# Patient Record
Sex: Female | Born: 1990 | Race: Black or African American | Hispanic: No | Marital: Single | Smoking: Never smoker
Health system: Southern US, Community
[De-identification: ages and names within clinical notes are randomized; demographics above are authoritative.]

## PROBLEM LIST (undated history)

## (undated) HISTORY — PX: WISDOM TOOTH EXTRACTION: SHX21

---
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
## 2011-01-11 LAB — WET PREP, GENITAL
Trich, Wet Prep: NONE SEEN
Yeast Wet Prep HPF POC: NONE SEEN

## 2011-01-11 LAB — URINALYSIS, ROUTINE W REFLEX MICROSCOPIC
Bilirubin Urine: NEGATIVE
Glucose, UA: NEGATIVE mg/dL
Ketones, ur: NEGATIVE mg/dL
Leukocytes, UA: NEGATIVE
Nitrite: NEGATIVE
Protein, ur: NEGATIVE mg/dL
Specific Gravity, Urine: 1.031 — ABNORMAL HIGH (ref 1.005–1.030)
Urobilinogen, UA: 0.2 mg/dL (ref 0.0–1.0)
pH: 6.5 (ref 5.0–8.0)

## 2011-01-11 LAB — GC/CHLAMYDIA PROBE AMP, GENITAL
Chlamydia, DNA Probe: NEGATIVE
GC Probe Amp, Genital: NEGATIVE

## 2011-01-11 LAB — URINE MICROSCOPIC-ADD ON

## 2011-07-21 LAB — RAPID STREP SCREEN (MED CTR MEBANE ONLY): Streptococcus, Group A Screen (Direct): NEGATIVE

## 2011-07-21 LAB — MONONUCLEOSIS SCREEN: Mono Screen: NEGATIVE

## 2012-08-26 ENCOUNTER — Ambulatory Visit: Payer: Self-pay | Admitting: Family

## 2012-09-07 ENCOUNTER — Encounter: Payer: Self-pay | Admitting: Family

## 2012-09-07 ENCOUNTER — Ambulatory Visit (INDEPENDENT_AMBULATORY_CARE_PROVIDER_SITE_OTHER): Payer: PRIVATE HEALTH INSURANCE | Admitting: Family

## 2012-09-07 VITALS — BP 90/64 | HR 64 | Temp 98.0°F | Resp 16 | Ht 64.75 in | Wt 107.1 lb

## 2012-09-07 DIAGNOSIS — Z0000 Encounter for general adult medical examination without abnormal findings: Secondary | ICD-10-CM | POA: Insufficient documentation

## 2012-09-07 DIAGNOSIS — Z23 Encounter for immunization: Secondary | ICD-10-CM

## 2012-09-07 NOTE — Patient Instructions (Addendum)
Please return to the lab on Friday AM fasting- for lab work. Follow up in 1 year, sooner as needed.

## 2012-09-07 NOTE — Progress Notes (Signed)
Subjective:    Patient ID: Suzanne Mays, female    DOB: 1991/06/11, 21 y.o.   MRN: [id number]  HPI  Preventative- reports severe menstrual cramping.  She will see GYN.  Tries otc advil.  Period is 5 days.  1st day 5-7 pads a day.   Exercise- walks her dogs. Diet is not healthy.  Drinks water.      Review of Systems  Constitutional: Negative for unexpected weight change.  HENT: Negative for congestion.   Respiratory: Positive for cough. Negative for shortness of breath.        Reports recent cough for a few days- using mucinex.   Cardiovascular: Negative for chest pain.  Gastrointestinal: Negative for vomiting and constipation.  Genitourinary: Negative for dysuria and frequency.  Musculoskeletal: Negative for myalgias and arthralgias.  Skin: Negative for rash.  Neurological: Negative for headaches.  Hematological: Negative for adenopathy.  Psychiatric/Behavioral:       Denies depression or anxiety   History reviewed. No pertinent past medical history.  History   Social History  . Marital Status: Single    Spouse Name: N/A    Number of Children: N/A  . Years of Education: N/A   Occupational History  . Not on file.   Social History Main Topics  . Smoking status: Never Smoker   . Smokeless tobacco: Never Used  . Alcohol Use: No  . Drug Use: Not on file  . Sexually Active: Not on file   Other Topics Concern  . 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 Wisdom tooth extraction     Family History  Problem Relation Age of Onset  . Hypertension Mother   . Diabetes Mother   . Stroke Maternal Grandmother   . Cancer Maternal Grandmother     breast & brain    No Known Allergies  No current outpatient prescriptions on file prior  to visit.    BP 90/64  Pulse 64  Temp 98 F (36.7 C) (Oral)  Resp 16  Ht 5' 4.75" (1.645 m)  Wt 107 lb 1.3 oz (48.571 kg)  BMI 17.96 kg/m2  SpO2 99%  LMP 09/07/2012       Objective:   Physical Exam Physical Exam  Constitutional: She is oriented to person, place, and time. She appears well-developed and well-nourished. No distress.  HENT:  Head: Normocephalic and atraumatic.  Right Ear: Tympanic membrane and ear canal normal.  Left Ear: Tympanic membrane and ear canal normal.  Mouth/Throat: Oropharynx is clear and moist.  Eyes: Pupils are equal, round, and reactive to light. No scleral icterus.  Neck: Normal range of motion. No thyromegaly present.  Cardiovascular: Normal rate and regular rhythm.   No murmur heard. Pulmonary/Chest: Effort normal and breath sounds normal. No respiratory distress. He has no wheezes. She has no rales. She exhibits no tenderness.  Abdominal: Soft. Bowel sounds are normal. He exhibits no distension and no mass. There is no tenderness. There is no rebound and no guarding.  Musculoskeletal: She exhibits no edema.  Lymphadenopathy:    She has no cervical adenopathy.  Neurological: She is alert and oriented to person, place, and time. She has normal reflexes. She exhibits normal muscle tone. Coordination normal.  Skin: Skin is warm and dry.  Psychiatric: She has a normal mood and affect. Her behavior is normal. Judgment and thought content normal.  Breast/pelvic- deferred to GYN.          Assessment & Plan:          Assessment & Plan:

## 2012-09-07 NOTE — Assessment & Plan Note (Signed)
Flu shot today. Pt counseled on healthy diet, exercise.  She plans to establish with GYN.  She will return on Friday for fasting lab work.

## 2012-09-09 LAB — CBC WITH DIFFERENTIAL/PLATELET
Basophils Absolute: 0 10*3/uL (ref 0.0–0.1)
Basophils Relative: 0 % (ref 0–1)
Eosinophils Relative: 4 % (ref 0–5)
HCT: 35.7 % — ABNORMAL LOW (ref 36.0–46.0)
Lymphocytes Relative: 25 % (ref 12–46)
MCHC: 33.3 g/dL (ref 30.0–36.0)
Monocytes Absolute: 0.6 10*3/uL (ref 0.1–1.0)
Neutro Abs: 3.6 10*3/uL (ref 1.7–7.7)
Platelets: 306 10*3/uL (ref 150–400)
RDW: 13.1 % (ref 11.5–15.5)
WBC: 5.9 10*3/uL (ref 4.0–10.5)

## 2012-09-09 LAB — LIPID PANEL
Cholesterol: 187 mg/dL (ref 0–200)
HDL: 47 mg/dL (ref >39)
Triglycerides: 49 mg/dL (ref <150)

## 2012-09-09 LAB — BASIC METABOLIC PANEL WITH GFR
BUN: 22 mg/dL (ref 6–23)
Calcium: 9.1 mg/dL (ref 8.4–10.5)
Chloride: 105 mEq/L (ref 96–112)
Creat: 0.77 mg/dL (ref 0.50–1.10)
GFR, Est Non African American: >89 mL/min

## 2012-09-09 LAB — HEPATIC FUNCTION PANEL
ALT: <8 U/L (ref 0–35)
Bilirubin, Direct: <0.1 mg/dL (ref 0.0–0.3)
Total Protein: 7.4 g/dL (ref 6.0–8.3)

## 2012-09-10 LAB — URINALYSIS, MICROSCOPIC ONLY
Casts: NONE SEEN
Crystals: NONE SEEN
RBC / HPF: >50 RBC/hpf — AB (ref <3)

## 2012-09-10 LAB — URINALYSIS, ROUTINE W REFLEX MICROSCOPIC
Glucose, UA: NEGATIVE mg/dL
Ketones, ur: 15 mg/dL — AB
Specific Gravity, Urine: 1.025 (ref 1.005–1.030)

## 2012-09-12 ENCOUNTER — Telehealth: Payer: Self-pay | Admitting: Family

## 2012-09-12 DIAGNOSIS — R809 Proteinuria, unspecified: Secondary | ICD-10-CM

## 2012-09-12 MED ORDER — CIPROFLOXACIN HCL 500 MG PO TABS: 500.0000 mg | ORAL_TABLET | Freq: Two times a day (BID) | ORAL | Status: DC

## 2012-09-12 NOTE — Telephone Encounter (Signed)
Pls call pt and let her know that her urinalysis looks like she has uti.  I would like her to take cipro x 3 days and repeat UA with reflex micro in 3 weeks.  (dx is proteinuria).  Other labs look good except mildly anemic.  She should add multivitamin with minerals.

## 2012-09-13 NOTE — Telephone Encounter (Signed)
Notified pt and she voices understanding. Future lab order entered and given to the lab for the week of 10/04/12.

## 2012-09-30 ENCOUNTER — Encounter: Payer: Self-pay | Admitting: Family

## 2012-09-30 ENCOUNTER — Ambulatory Visit: Payer: PRIVATE HEALTH INSURANCE | Admitting: Family

## 2012-09-30 ENCOUNTER — Ambulatory Visit (INDEPENDENT_AMBULATORY_CARE_PROVIDER_SITE_OTHER): Payer: PRIVATE HEALTH INSURANCE | Admitting: Family

## 2012-09-30 VITALS — BP 90/68 | HR 76 | Temp 98.0°F | Resp 16 | Ht 64.75 in | Wt 107.0 lb

## 2012-09-30 DIAGNOSIS — Z01419 Encounter for gynecological examination (general) (routine) without abnormal findings: Secondary | ICD-10-CM | POA: Insufficient documentation

## 2012-09-30 DIAGNOSIS — Z124 Encounter for screening for malignant neoplasm of cervix: Secondary | ICD-10-CM

## 2012-09-30 DIAGNOSIS — Z113 Encounter for screening for infections with a predominantly sexual mode of transmission: Secondary | ICD-10-CM | POA: Insufficient documentation

## 2012-09-30 DIAGNOSIS — N946 Dysmenorrhea, unspecified: Secondary | ICD-10-CM

## 2012-09-30 MED ORDER — NORETHINDRONE ACET-ETHINYL EST 1.5-30 MG-MCG PO TABS: 1.0000 | ORAL_TABLET | Freq: Every day | ORAL | Status: DC

## 2012-09-30 NOTE — Progress Notes (Signed)
  Subjective:    Patient ID: Suzanne Mays, female    DOB: 1991/08/03, 21 y.o.   MRN: [id number]  HPI  Ms.  Mays is a 21 yr old female who presents today with chief complaining of dysmenorrhea. LMP 11/20. Period last 5 days.  Very heavy and painful the first 2 days, and then improves.  She is sexually active and has had 1 partner in the last 6 months.  Her last pap smear was when she was in HS and she reports that this was normal.   Review of Systems     No past medical history on file.  History   Social History  . Marital Status: Single    Spouse Name: N/A    Number of Children: N/A  . Years of Education: N/A   Occupational History  . Not on file.   Social History Main Topics  . Smoking status: Never Smoker   . Smokeless tobacco: Never Used  . Alcohol Use: No  . Drug Use: Not on file  . Sexually Active: Not on file   Other Topics Concern  . 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 Wisdom tooth extraction     Family History  Problem Relation Age of Onset  . Hypertension Mother   . Diabetes Mother   . Stroke Maternal Grandmother   . Cancer Maternal Grandmother     breast & brain    No Known Allergies  Current Outpatient Prescriptions on File Prior to Visit  Medication Sig Dispense Refill  . ciprofloxacin (CIPRO) 500 MG tablet Take 1 tablet (500 mg total) by mouth 2 (two) times daily.  6 tablet  0  . Prenat-FeCbn-FeBisg-FA-Omega (MULTIVITAMIN/MINERALS PO) Take 1 tablet by mouth.        BP 90/68  Pulse 76  Temp 98 F (36.7 C) (Oral)  Resp 16  Ht 5' 4.75" (1.645 m)  Wt 107 lb (48.535 kg)  BMI 17.94 kg/m2  SpO2 99%  LMP 09/07/2012    Objective:   Physical Exam  Constitutional: She is oriented to person, place, and  time. She appears well-developed and well-nourished. No distress.  Cardiovascular: Normal rate and regular rhythm.   No murmur heard. Pulmonary/Chest: Effort normal and breath sounds normal. No respiratory distress. She has no wheezes. She has no rales. She exhibits no tenderness.  Genitourinary:       Breasts: Examined lying  Right: Without masses, retractions, discharge or axillary adenopathy.  Left: Without masses, retractions, discharge or axillary adenopathy.  Inguinal/mons: Normal without inguinal adenopathy  External genitalia: Normal  BUS/Urethra/Skene's glands: Normal  Bladder: Normal  Vagina: Normal  Cervix: Normal  Uterus: normal in size, shape and contour. Midline and mobile  Adnexa/parametria:  Rt: Without masses or tenderness.  Lt: Without masses or tenderness.  Anus and perineum: Normal    Neurological: She is alert and oriented to person, place, and time.  Psychiatric: She has a normal mood and affect. Her behavior is normal. Judgment and thought content normal.          Assessment & Plan:

## 2012-09-30 NOTE — Patient Instructions (Addendum)
Follow up in 6 months for Pap smear- sooner if problems or concerns.

## 2012-10-03 DIAGNOSIS — N946 Dysmenorrhea, unspecified: Secondary | ICD-10-CM | POA: Insufficient documentation

## 2012-10-03 NOTE — Assessment & Plan Note (Signed)
Will give trial of OCP to see if this will help her dysmenorrhea.  Pap performed today.

## 2012-10-05 ENCOUNTER — Telehealth: Payer: Self-pay | Admitting: Family

## 2012-10-05 ENCOUNTER — Encounter: Payer: Self-pay | Admitting: Family

## 2012-10-05 DIAGNOSIS — R87629 Unspecified abnormal cytological findings in specimens from vagina: Secondary | ICD-10-CM

## 2012-10-05 DIAGNOSIS — R87619 Unspecified abnormal cytological findings in specimens from cervix uteri: Secondary | ICD-10-CM

## 2012-10-05 HISTORY — DX: Unspecified abnormal cytological findings in specimens from vagina: R87.629

## 2012-10-05 NOTE — Telephone Encounter (Signed)
Reviewed pap smear results- CIN-1/HPV (LSIL). Reviewed abnormal results with pt and need for her to be evaluated by GYN. She would like to see same OB/GYN as her mom and will call us this afternoon with the name.

## 2012-10-06 ENCOUNTER — Other Ambulatory Visit (HOSPITAL_COMMUNITY)
Admission: RE | Admit: 2012-10-06 | Discharge: 2012-10-06 | Disposition: A | Discharge status: Home / Self Care | Payer: PRIVATE HEALTH INSURANCE | Source: Ambulatory Visit | Attending: Family | Admitting: Family

## 2012-10-25 ENCOUNTER — Encounter: Payer: Self-pay | Admitting: Obstetrics and Gynecology

## 2012-10-25 ENCOUNTER — Ambulatory Visit (INDEPENDENT_AMBULATORY_CARE_PROVIDER_SITE_OTHER): Payer: PRIVATE HEALTH INSURANCE | Admitting: Obstetrics and Gynecology

## 2012-10-25 VITALS — BP 98/62 | HR 70 | Ht 64.5 in | Wt 113.0 lb

## 2012-10-25 DIAGNOSIS — Z87898 Personal history of other specified conditions: Secondary | ICD-10-CM

## 2012-10-25 DIAGNOSIS — Z8742 Personal history of other diseases of the female genital tract: Secondary | ICD-10-CM

## 2012-10-25 DIAGNOSIS — IMO0001 Reserved for inherently not codable concepts without codable children: Secondary | ICD-10-CM

## 2012-10-25 DIAGNOSIS — R350 Frequency of micturition: Secondary | ICD-10-CM

## 2012-10-25 DIAGNOSIS — Z309 Encounter for contraceptive management, unspecified: Secondary | ICD-10-CM

## 2012-10-25 LAB — POCT URINALYSIS DIPSTICK
Blood, UA: NEGATIVE
Ketones, UA: NEGATIVE
Protein, UA: NEGATIVE
Spec Grav, UA: <=1.005
pH, UA: 7

## 2012-10-25 MED ORDER — NORETHIN ACE-ETH ESTRAD-FE 1.5-30 MG-MCG PO TABS: 1.0000 | ORAL_TABLET | Freq: Every day | ORAL | Status: DC

## 2012-10-25 NOTE — Progress Notes (Signed)
Regular Periods: yes Mammogram: no  Monthly Breast Ex.: yes Exercise: no  Tetanus < 10 years: yes Seatbelts: yes  NI. Bladder Functn.: yes Abuse at home: no  Daily BM's: yes Stressful Work: no  Healthy Diet: yes Sigmoid-Colonoscopy: NO  Calcium: no Medical problems this year: DISCUSS BIRTH CONTROL AND ABNORMAL PAP    LAST PAP:12/13  Contraception: JUNEL  Mammogram:  NO  PCP: DR. Peggyann Juba  PMH: NO CHANGE  FMH: NO CHANGE  Last Bone Scan: NO  PT IS SINGLE;IN A RELATIONSHIP

## 2012-10-25 NOTE — Progress Notes (Signed)
Subjective:    Suzanne Mays is a 22 y.o. female, G0P0, who presents for an annual exam. 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   Menstrual cycle:   LMP: Patient's last menstrual period was 10/09/2012. Flow x 5 days with pad change every 3 hours with 10/10 cramps made better by BCPs             Review of Systems Pertinent items are noted in HPI. Denies pelvic pain, urinary tract symptoms, vaginitis symptoms, irregular bleeding, menopausal symptoms, change in bowel habits or rectal bleeding   Objective:    BP 98/62  Pulse 70  Ht 5' 4.5" (1.638 m)  Wt 113 lb (51.256 kg)  BMI 19.10 kg/m2  LMP 10/09/2012     Wt Readings from Last 1 Encounters:  10/25/12 113 lb (51.256 kg)   Body mass index is 19.10 kg/(m^2). General Appearance: Alert, no acute distress HEENT: Grossly normal Neck / Thyroid: Supple, no thyromegaly or cervical adenopathy Lungs: Clear to auscultation bilaterally Back: No CVA tenderness Breast Exam: No masses or nodes.No dimpling, nipple retraction or discharge. Cardiovascular: Regular rate and rhythm.  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 Peggyann Juba  results of abnormal PAP  Will schedule colposcopy once results are confirmed  RTO 1 year or prn  Falana Clagg,ELMIRAPA-C

## 2012-10-26 ENCOUNTER — Telehealth: Payer: Self-pay

## 2012-10-26 NOTE — Telephone Encounter (Signed)
TC TO PT REGARDING ABNORMAL PAP. PER EP, NEED TO SCHEDULE A COLPOSCOPY FOR PT. WENT OVER COLPO PROTOCOL AND INFORMED PT THAT I WILL SEND INFORMATION ON COLPOSCOPY. PROCEDURE SCHEDULED FOR 11/10/12 AT 2:45 PM WITH ND. INFORMED PT THAT IF SHE CANNOT KEEP APPT. OR HER CYCLE IS ON AND HEAVY TO CALL 24 HOUR BEFORE PROCEDURE TO RESCHEDULE. PT VOICED UNDERSTANDING.

## 2012-10-26 NOTE — Telephone Encounter (Signed)
Lm for pt to call back

## 2012-10-26 NOTE — Telephone Encounter (Signed)
Message copied by Winfred Leeds on Wed Oct 26, 2012  3:51 PM ------      Message from: Henreitta Leber      Created: Wed Oct 26, 2012  7:24 AM       Schedule with first available M.D. for colposcopy and notify patient. Thank you,  EP

## 2012-11-02 ENCOUNTER — Encounter: Payer: Self-pay | Admitting: Family

## 2012-11-02 NOTE — Telephone Encounter (Signed)
Please see patient's My Chart message & advise. Thanks/SLS

## 2012-11-10 ENCOUNTER — Ambulatory Visit: Payer: PRIVATE HEALTH INSURANCE | Admitting: Obstetrics and Gynecology

## 2012-11-10 ENCOUNTER — Encounter: Payer: Self-pay | Admitting: Obstetrics and Gynecology

## 2012-11-10 VITALS — BP 100/60 | Wt 110.0 lb

## 2012-11-10 DIAGNOSIS — IMO0002 Reserved for concepts with insufficient information to code with codable children: Secondary | ICD-10-CM

## 2012-11-10 DIAGNOSIS — N898 Other specified noninflammatory disorders of vagina: Secondary | ICD-10-CM

## 2012-11-10 DIAGNOSIS — R87612 Low grade squamous intraepithelial lesion on cytologic smear of cervix (LGSIL): Secondary | ICD-10-CM

## 2012-11-10 LAB — POCT URINE PREGNANCY: Preg Test, Ur: NEGATIVE

## 2012-11-10 LAB — POCT WET PREP (WET MOUNT)
Clue Cells Wet Prep Whiff POC: POSITIVE
PH, VAGINAL: 5.5

## 2012-11-10 MED ORDER — METRONIDAZOLE 500 MG PO TABS: 500.0000 mg | ORAL_TABLET | Freq: Two times a day (BID) | ORAL | Status: DC

## 2012-11-10 NOTE — Progress Notes (Signed)
Previous Pap Smear: LSIL CIN-1 HPV  Previous Colposcopy: n/a Referred From: n/a LMP: 10/09/12 Contraception: pill G,P: 0, 0 colpo adequate bx at 6 oclock .  AW changes noted thereeECc done Wet prep c/w BV RT 6 months for pap Flagyl rx given to pt

## 2012-11-10 NOTE — Patient Instructions (Signed)

## 2012-11-14 ENCOUNTER — Encounter: Payer: Self-pay | Admitting: Family

## 2012-11-14 ENCOUNTER — Ambulatory Visit (INDEPENDENT_AMBULATORY_CARE_PROVIDER_SITE_OTHER): Payer: PRIVATE HEALTH INSURANCE | Admitting: Family

## 2012-11-14 VITALS — BP 90/56 | HR 74 | Temp 98.6°F | Resp 16 | Ht 64.75 in | Wt 111.0 lb

## 2012-11-14 DIAGNOSIS — F32A Depression, unspecified: Secondary | ICD-10-CM

## 2012-11-14 DIAGNOSIS — B9689 Other specified bacterial agents as the cause of diseases classified elsewhere: Secondary | ICD-10-CM

## 2012-11-14 DIAGNOSIS — N760 Acute vaginitis: Secondary | ICD-10-CM

## 2012-11-14 DIAGNOSIS — A499 Bacterial infection, unspecified: Secondary | ICD-10-CM

## 2012-11-14 DIAGNOSIS — R87629 Unspecified abnormal cytological findings in specimens from vagina: Secondary | ICD-10-CM

## 2012-11-14 DIAGNOSIS — F329 Major depressive disorder, single episode, unspecified: Secondary | ICD-10-CM

## 2012-11-14 MED ORDER — SERTRALINE HCL 50 MG PO TABS: 50.0000 mg | ORAL_TABLET | Freq: Every day | ORAL | Status: DC

## 2012-11-14 NOTE — Patient Instructions (Addendum)
Please follow up in 1 month.  

## 2012-11-14 NOTE — Assessment & Plan Note (Signed)
This is being managed by GYN.

## 2012-11-14 NOTE — Assessment & Plan Note (Addendum)
I suspect that depression is contributing to her "fatigue."  PHQ-9 was completed and pt scored 14/27 placing her in the "moderate depression" category.  Will give trial of sertraline- I instructed pt to start 1/2 tablet once daily for 1 week and then increase to a full tablet once daily on week two as tolerated.  We discussed common side effects such as nausea, drowsiness and weight gain.  Also discussed rare but serious side effect of suicide ideation.  She is instructed to discontinue medication go directly to ED if this occurs.  Pt verbalizes understanding.  Plan follow up in 1 month to evaluate progress.  15 minutes spent with pt today.  >50% of this time was spent counseling pt on her depression/treatment.

## 2012-11-14 NOTE — Progress Notes (Signed)
  Subjective:    Patient ID: Suzanne Mays, female    DOB: 10-08-1991, 22 y.o.   MRN: [id number]  HPI  Fatigue- reports that she wakes up feeling tired.  She sleeps 7 hours.  Often feels like she is unmotivated, sluggish, "just there." She started centrum but doesn't feel that this is helping her energy.   Bacterial vaginosis- recently saw GYN for her abnormal pap and wet prep + BV. She is currently taking metronidazole.    Review of Systems See HPI  No past medical history on file.  History   Social History  . Marital Status: Single    Spouse Name: N/A    Number of Children: N/A  . Years of Education: N/A   Occupational History  . Not on file.   Social History Main Topics  . Smoking status: Never Smoker   . Smokeless tobacco: Never Used  . Alcohol Use: No  . Drug Use: No  . Sexually Active: Yes    Birth Control/ Protection: Pill     Comment: JUNEL    Other Topics Concern  . 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 Wisdom tooth extraction     Family History  Problem Relation Age of Onset  . Hypertension Mother   . Diabetes Mother   . Stroke Maternal Grandmother   . Cancer Maternal Grandmother     breast & brain    No Known Allergies  Current Outpatient Prescriptions on File Prior to Visit  Medication Sig Dispense Refill  . metroNIDAZOLE (FLAGYL) 500 MG tablet Take 1 tablet (500 mg total) by mouth 2 (two) times daily.  14 tablet  0  . Multiple Vitamins-Minerals (CENTRUM PO) Take 1 tablet by mouth daily.      . norethindrone-ethinyl estradiol-iron (MICROGESTIN FE,GILDESS FE,LOESTRIN FE) 1.5-30 MG-MCG tablet Take 1 tablet by mouth daily.  1 Package  11    BP 90/56  Pulse 74  Temp 98.6 F (37 C) (Oral)  Resp 16  Ht 5'  4.75" (1.645 m)  Wt 111 lb (50.349 kg)  BMI 18.61 kg/m2  SpO2 99%  LMP 10/09/2012       Objective:   Physical Exam  Constitutional: She appears well-developed and well-nourished. No distress.  Psychiatric: Her behavior is normal. Judgment and thought content normal.       Pleasant but slightly flat affect          Assessment & Plan:

## 2012-11-14 NOTE — Assessment & Plan Note (Signed)
Recommended that pt complete metronidazole. Call if recurrent symptoms next month.

## 2012-11-17 ENCOUNTER — Telehealth: Payer: Self-pay

## 2012-11-17 NOTE — Telephone Encounter (Signed)
Spoke with pt rgd labs informed results pt voice understanding

## 2012-11-17 NOTE — Telephone Encounter (Signed)
Message copied by Rolla Plate on Thu Nov 17, 2012  9:02 AM ------      Message from: Jaymes Graff      Created: Wed Nov 16, 2012  9:10 PM       Please review colpo results with patient and tell her I recommend a pap every six months for the next year.

## 2012-12-22 ENCOUNTER — Encounter: Payer: Self-pay | Admitting: Obstetrics and Gynecology

## 2012-12-23 ENCOUNTER — Telehealth: Payer: Self-pay | Admitting: Obstetrics and Gynecology

## 2012-12-23 NOTE — Telephone Encounter (Signed)
TC to pt in response to E-mail.   LM to return call.

## 2012-12-27 ENCOUNTER — Encounter: Payer: PRIVATE HEALTH INSURANCE | Admitting: Obstetrics and Gynecology

## 2013-01-13 ENCOUNTER — Encounter (HOSPITAL_BASED_OUTPATIENT_CLINIC_OR_DEPARTMENT_OTHER): Payer: Self-pay | Admitting: *Deleted

## 2013-01-13 ENCOUNTER — Emergency Department (HOSPITAL_BASED_OUTPATIENT_CLINIC_OR_DEPARTMENT_OTHER)
Admission: EM | Admit: 2013-01-13 | Discharge: 2013-01-13 | Disposition: A | Discharge status: Home / Self Care | Payer: No Typology Code available for payment source | Attending: Emergency Medicine | Admitting: Emergency Medicine

## 2013-01-13 DIAGNOSIS — S8990XA Unspecified injury of unspecified lower leg, initial encounter: Secondary | ICD-10-CM | POA: Insufficient documentation

## 2013-01-13 DIAGNOSIS — Y9389 Activity, other specified: Secondary | ICD-10-CM | POA: Insufficient documentation

## 2013-01-13 DIAGNOSIS — Z79899 Other long term (current) drug therapy: Secondary | ICD-10-CM | POA: Insufficient documentation

## 2013-01-13 DIAGNOSIS — IMO0002 Reserved for concepts with insufficient information to code with codable children: Secondary | ICD-10-CM | POA: Insufficient documentation

## 2013-01-13 DIAGNOSIS — S59909A Unspecified injury of unspecified elbow, initial encounter: Secondary | ICD-10-CM | POA: Insufficient documentation

## 2013-01-13 DIAGNOSIS — S6990XA Unspecified injury of unspecified wrist, hand and finger(s), initial encounter: Secondary | ICD-10-CM | POA: Insufficient documentation

## 2013-01-13 DIAGNOSIS — S99929A Unspecified injury of unspecified foot, initial encounter: Secondary | ICD-10-CM | POA: Insufficient documentation

## 2013-01-13 DIAGNOSIS — Y92410 Unspecified street and highway as the place of occurrence of the external cause: Secondary | ICD-10-CM | POA: Insufficient documentation

## 2013-01-13 DIAGNOSIS — S199XXA Unspecified injury of neck, initial encounter: Secondary | ICD-10-CM | POA: Insufficient documentation

## 2013-01-13 DIAGNOSIS — S0993XA Unspecified injury of face, initial encounter: Secondary | ICD-10-CM | POA: Insufficient documentation
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
## 2013-01-13 NOTE — ED Notes (Signed)
Pt c/o "sore and aching" all over. When asked what hurts the most, pt states her low back and right wrist.
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
## 2013-01-16 ENCOUNTER — Ambulatory Visit (HOSPITAL_BASED_OUTPATIENT_CLINIC_OR_DEPARTMENT_OTHER)
Admission: RE | Admit: 2013-01-16 | Discharge: 2013-01-16 | Disposition: A | Discharge status: Home / Self Care | Payer: PRIVATE HEALTH INSURANCE | Source: Ambulatory Visit | Attending: Family | Admitting: Family

## 2013-01-16 ENCOUNTER — Ambulatory Visit (INDEPENDENT_AMBULATORY_CARE_PROVIDER_SITE_OTHER): Payer: PRIVATE HEALTH INSURANCE | Admitting: Family

## 2013-01-16 ENCOUNTER — Encounter: Payer: Self-pay | Admitting: Family

## 2013-01-16 VITALS — BP 98/60 | HR 68 | Temp 98.6°F | Resp 16 | Wt 110.1 lb

## 2013-01-16 DIAGNOSIS — M545 Low back pain, unspecified: Secondary | ICD-10-CM | POA: Insufficient documentation

## 2013-01-16 DIAGNOSIS — IMO0001 Reserved for inherently not codable concepts without codable children: Secondary | ICD-10-CM

## 2013-01-16 DIAGNOSIS — M549 Dorsalgia, unspecified: Secondary | ICD-10-CM

## 2013-01-16 DIAGNOSIS — Z0189 Encounter for other specified special examinations: Secondary | ICD-10-CM

## 2013-01-16 DIAGNOSIS — M7918 Myalgia, other site: Secondary | ICD-10-CM | POA: Insufficient documentation

## 2013-01-16 HISTORY — DX: Myalgia, other site: M79.18

## 2013-01-16 MED ORDER — CYCLOBENZAPRINE HCL 5 MG PO TABS: 5.0000 mg | ORAL_TABLET | Freq: Every evening | ORAL | Status: DC | PRN

## 2013-01-16 MED ORDER — NORETHIN ACE-ETH ESTRAD-FE 1.5-30 MG-MCG PO TABS: 1.0000 | ORAL_TABLET | Freq: Every day | ORAL | Status: DC

## 2013-01-16 MED ORDER — MELOXICAM 7.5 MG PO TABS: 7.5000 mg | ORAL_TABLET | Freq: Every day | ORAL | Status: DC
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
## 2013-01-16 NOTE — Patient Instructions (Addendum)
Please complete your x ray on the first floor.  Follow up in 6 months, sooner if problems or concerns.

## 2013-01-18 ENCOUNTER — Other Ambulatory Visit: Payer: Self-pay | Admitting: Family

## 2013-01-19 MED ORDER — CYCLOBENZAPRINE HCL 5 MG PO TABS: 5.0000 mg | ORAL_TABLET | Freq: Every evening | ORAL | Status: DC | PRN

## 2013-01-19 MED ORDER — MELOXICAM 7.5 MG PO TABS: 7.5000 mg | ORAL_TABLET | Freq: Every day | ORAL | Status: DC

## 2013-05-24 ENCOUNTER — Encounter: Payer: Self-pay | Admitting: Family

## 2013-05-24 NOTE — Telephone Encounter (Signed)
Can you please schedule this pt?

## 2013-05-29 ENCOUNTER — Ambulatory Visit: Payer: PRIVATE HEALTH INSURANCE | Admitting: Physician Assistant

## 2013-05-30 ENCOUNTER — Ambulatory Visit (INDEPENDENT_AMBULATORY_CARE_PROVIDER_SITE_OTHER): Payer: PRIVATE HEALTH INSURANCE | Admitting: Physician Assistant

## 2013-05-30 ENCOUNTER — Telehealth: Payer: Self-pay | Admitting: *Deleted

## 2013-05-30 ENCOUNTER — Encounter: Payer: Self-pay | Admitting: Physician Assistant

## 2013-05-30 VITALS — BP 94/62 | HR 94 | Temp 98.9°F | Resp 14 | Wt 111.5 lb

## 2013-05-30 DIAGNOSIS — Z23 Encounter for immunization: Secondary | ICD-10-CM

## 2013-05-30 DIAGNOSIS — A09 Infectious gastroenteritis and colitis, unspecified: Secondary | ICD-10-CM

## 2013-05-30 DIAGNOSIS — Z789 Other specified health status: Secondary | ICD-10-CM | POA: Insufficient documentation

## 2013-05-30 DIAGNOSIS — Z111 Encounter for screening for respiratory tuberculosis: Secondary | ICD-10-CM

## 2013-05-30 DIAGNOSIS — J020 Streptococcal pharyngitis: Secondary | ICD-10-CM

## 2013-05-30 MED ORDER — NORETHIN ACE-ETH ESTRAD-FE 1.5-30 MG-MCG PO TABS: 1.0000 | ORAL_TABLET | Freq: Every day | ORAL | Status: DC

## 2013-05-30 MED ORDER — CHLOROQUINE PHOSPHATE 250 MG PO TABS: ORAL_TABLET | ORAL | Status: DC

## 2013-05-30 MED ORDER — TYPHOID VACCINE PO CPDR: 1.0000 | DELAYED_RELEASE_CAPSULE | ORAL | Status: DC

## 2013-05-30 MED ORDER — CIPROFLOXACIN HCL 500 MG PO TABS: 500.0000 mg | ORAL_TABLET | Freq: Two times a day (BID) | ORAL | Status: DC

## 2013-05-30 NOTE — Telephone Encounter (Signed)
Spoke with pt, doesn't have any medication specific forms. Requests letter to keep on hand in case she needs proof of medical necessity. Will pick up letter at PPD reading.

## 2013-05-30 NOTE — Progress Notes (Signed)
Patient ID: Suzanne Mays, female   DOB: 08-28-1991, 22 y.o.   MRN: [id number]   Patient is a 22 year-old female who presents to clinic today for immunizations prior to travel abroad to [place].   Patient is also wanting [place] to take a look at her throat to make sure she is better.  Was recently diagnosed and treated for strep pharyngitis 1 week ago.  Has felt better since then.  Noticed she has developed some nasal congestion but denies sinus pressure/pain, sore throat, cough, COB or wheezing.  Patient denies fever, N/V/C/D.    No past medical history on file.  Current Outpatient Prescriptions on File Prior to Visit  Medication Sig Dispense Refill  . cyclobenzaprine (FLEXERIL) 5 MG tablet Take 1 tablet (5 mg total) by mouth at bedtime as needed for muscle spasms.  14 tablet  0  . meloxicam (MOBIC) 7.5 MG tablet Take 1 tablet (7.5 mg total) by mouth daily.  14 tablet  0  . Multiple Vitamins-Minerals (CENTRUM PO) Take 1 tablet by mouth daily.       No current facility-administered medications on file prior to visit.    No Known Allergies  Family History  Problem Relation Age of Onset  . Hypertension Mother   . Diabetes Mother   . Stroke Maternal Grandmother   . Cancer Maternal Grandmother     breast & brain   History   Social History  . Marital Status: Single    Spouse Name: N/A    Number of Children: N/A  . Years of Education: N/A   Social History Main Topics  . Smoking status: Never Smoker   . Smokeless tobacco: Never Used  . Alcohol Use: No  . Drug Use: No  . Sexually Active: Yes    Birth Control/ Protection: Pill     Comment: JUNEL    Other Topics Concern  . None   Social History Narrative   Journalism/media studies- [employer]   Lives with mom and 2 dogs   Enjoys sleeping in her spare time.    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  HENT: Positive for congestion. Negative for nosebleeds and sore throat.   Respiratory: Negative for cough, hemoptysis, sputum production, shortness of breath and wheezing.   Gastrointestinal: Negative for nausea, vomiting, abdominal pain, diarrhea and constipation.  Musculoskeletal: Negative for myalgias.  Skin: Negative for rash.  Neurological: Negative for headaches.   Filed Vitals:   05/30/13 0931  BP: 94/62  Pulse: 94  Temp: 98.9 F (37.2 C)  Resp: 14   Physical Exam  Constitutional: She is oriented to person, place, and time and well-developed, well-nourished, and in no distress. No distress.  HENT:  Head: Normocephalic and atraumatic.  Right Ear: External ear normal.  Left Ear: External ear normal.  Nose: Nose normal.  Mouth/Throat: Oropharynx is clear and moist. No oropharyngeal exudate.  TMs WNL bilaterally  Eyes: Conjunctivae are normal. Pupils are equal, round, and reactive to light.  Neck: Normal range of motion. Neck supple.  Cardiovascular: Normal rate, regular rhythm, normal heart sounds and intact distal pulses.   Pulmonary/Chest: Effort normal and breath sounds normal. No respiratory distress. She has no wheezes. She has no rales. She exhibits no tenderness.  Abdominal: Soft. Bowel sounds are normal. She exhibits no distension. There is no tenderness.  Lymphadenopathy:    She has no cervical adenopathy.  Neurological: She is alert and oriented to person, place, and time.  Skin: Skin is warm and dry. No rash noted. She is not diaphoretic.   .Assessment/Plan: Strep pharyngitis resolved  Travel foreign Rx Ciprofloxacin given for traveler's diarrhea prophylaxis.  PPD Placement.  Hepatitis A Immunization given.  Rx for Typhoid PO vaccine given.  Rx for colchicine for malaria prophylaxis given.  Patient instructed on proper regimen for malaria prophylaxis.  Patient to return in 48 hours for PPD reading and to retrieve copy of her  immunization records.

## 2013-05-30 NOTE — Telephone Encounter (Signed)
Patient is returning in 48 hours for PPD skin test reading.  We can print out her immunization records and give her a letter with the required information on it.  Ask her is she has a form for [place] to fill out or if I need to type a letter with all of the information in it.

## 2013-05-30 NOTE — Telephone Encounter (Signed)
Microgestin rx printed at today's office visit and was called to pharmacy voicemail per order in EPIC.

## 2013-05-30 NOTE — Assessment & Plan Note (Signed)
resolved 

## 2013-05-30 NOTE — Telephone Encounter (Signed)
Pt left message stating she forgot to request a letter indicating why she needs each medication that she is taking. She needs this for her upcoming trip in case her medications get confiscated she will have a letter of medical necessity. Please advise.

## 2013-05-30 NOTE — Assessment & Plan Note (Signed)
Rx Ciprofloxacin given for traveler's diarrhea prophylaxis.  PPD Placement.  Hepatitis A Immunization given.  Rx for Typhoid PO vaccine given.  Rx for colchicine for malaria prophylaxis given.  Patient instructed on proper regimen for malaria prophylaxis.  Patient to return in 48 hours for PPD reading and to retrieve copy of her immunization records.

## 2013-06-01 ENCOUNTER — Encounter: Payer: Self-pay | Admitting: Physician Assistant

## 2013-06-01 LAB — TB SKIN TEST
Induration: 0 mm
TB Skin Test: NEGATIVE

## 2013-08-24 ENCOUNTER — Other Ambulatory Visit: Payer: Self-pay

## 2013-10-08 ENCOUNTER — Encounter (HOSPITAL_BASED_OUTPATIENT_CLINIC_OR_DEPARTMENT_OTHER): Payer: Self-pay | Admitting: Emergency Medicine

## 2013-10-08 DIAGNOSIS — Z79899 Other long term (current) drug therapy: Secondary | ICD-10-CM | POA: Insufficient documentation

## 2013-10-08 DIAGNOSIS — Z792 Long term (current) use of antibiotics: Secondary | ICD-10-CM | POA: Insufficient documentation

## 2013-10-08 DIAGNOSIS — R52 Pain, unspecified: Secondary | ICD-10-CM | POA: Insufficient documentation

## 2013-10-08 DIAGNOSIS — Z791 Long term (current) use of non-steroidal anti-inflammatories (NSAID): Secondary | ICD-10-CM | POA: Insufficient documentation

## 2013-10-08 DIAGNOSIS — Z3202 Encounter for pregnancy test, result negative: Secondary | ICD-10-CM | POA: Insufficient documentation

## 2013-10-08 DIAGNOSIS — R609 Edema, unspecified: Secondary | ICD-10-CM | POA: Insufficient documentation

## 2013-10-08 NOTE — ED Notes (Signed)
Pt c/o generalized body aches and feet swelling. Pt just returned to [place] from [place] and had a 13 hour flight. Pt in NAD in triage, denies fever or rash. No SOB.

## 2013-10-09 ENCOUNTER — Emergency Department (HOSPITAL_BASED_OUTPATIENT_CLINIC_OR_DEPARTMENT_OTHER)
Admission: EM | Admit: 2013-10-09 | Discharge: 2013-10-09 | Disposition: A | Discharge status: Home / Self Care | Payer: PRIVATE HEALTH INSURANCE | Attending: Emergency Medicine | Admitting: Emergency Medicine

## 2013-10-09 ENCOUNTER — Telehealth: Payer: Self-pay | Admitting: Family

## 2013-10-09 DIAGNOSIS — R609 Edema, unspecified: Secondary | ICD-10-CM

## 2013-10-09 LAB — URINALYSIS, ROUTINE W REFLEX MICROSCOPIC
Bilirubin Urine: NEGATIVE
Ketones, ur: NEGATIVE mg/dL
Leukocytes, UA: NEGATIVE
Nitrite: NEGATIVE
Urobilinogen, UA: 1 mg/dL (ref 0.0–1.0)
pH: 7.5 (ref 5.0–8.0)

## 2013-10-09 MED ORDER — NAPROXEN 250 MG PO TABS
500.0000 mg | ORAL_TABLET | Freq: Once | ORAL | Status: AC
  Administered 2013-10-09: 500 mg via ORAL

## 2013-10-09 MED ORDER — NAPROXEN 250 MG PO TABS
ORAL_TABLET | ORAL | Status: AC
  Filled 2013-10-09: qty 2

## 2013-10-09 NOTE — ED Notes (Signed)
D/c home with family- medicated for pain at d/c- smiling and pleasant at d/c

## 2013-10-09 NOTE — ED Provider Notes (Signed)
CSN: [id number]     Arrival date & time 10/08/13  2343 History   First MD Initiated Contact with Patient 10/09/13 0157     Chief Complaint  Patient presents with  . Leg Swelling   (Consider location/radiation/quality/duration/timing/severity/associated sxs/prior Treatment) HPI This is a 22 year old female who had a 13 hour flight from [place] yesterday landing at about 7 PM. By the time she landed she had edema in her ankles and feet significant enough that she could not put her shoes on. She also developed generalized muscle aches. Symptoms are moderate. The swelling in her lower legs has nearly resolved with elevation. She is concerned about a DVT. She denies chest pain or shortness of breath. She has had no fever, chills or other evidence of infection. She had no similar symptoms when she flew to [place].  History reviewed. No pertinent past medical history. Past Surgical History  Procedure Laterality Date  . Wisdom tooth extraction     Family History  Problem Relation Age of Onset  . Hypertension Mother   . Diabetes Mother   . Stroke Maternal Grandmother   . Cancer Maternal Grandmother     breast & brain   History  Substance Use Topics  . Smoking status: Never Smoker   . Smokeless tobacco: Never Used  . Alcohol Use: No   OB History   Grav Para Term Preterm Abortions TAB SAB Ect Mult Living   0              Review of Systems  All other systems reviewed and are negative.    Allergies  Review of patient's allergies indicates no known allergies.  Home Medications   Current Outpatient Rx  Name  Route  Sig  Dispense  Refill  . norethindrone-ethinyl estradiol-iron (MICROGESTIN FE,GILDESS FE,LOESTRIN FE) 1.5-30 MG-MCG tablet   Oral   Take 1 tablet by mouth daily.   4 Package   0     Give 28 day packet please-patient going out of cou ...   . chloroquine (ARALEN) 250 MG tablet      Take 2 tablets (500mg ) once a week, until all tablets are gone   18 tablet   0    . ciprofloxacin (CIPRO) 500 MG tablet   Oral   Take 1 tablet (500 mg total) by mouth 2 (two) times daily.   20 tablet   0   . cyclobenzaprine (FLEXERIL) 5 MG tablet   Oral   Take 1 tablet (5 mg total) by mouth at bedtime as needed for muscle spasms.   14 tablet   0   . meloxicam (MOBIC) 7.5 MG tablet   Oral   Take 1 tablet (7.5 mg total) by mouth daily.   14 tablet   0   . Multiple Vitamins-Minerals (CENTRUM PO)   Oral   Take 1 tablet by mouth daily.         . typhoid (VIVOTIF) DR capsule   Oral   Take 1 capsule by mouth every other day.   14 capsule   0    BP 104/70  Pulse 89  Temp(Src) 98.3 F (36.8 C) (Oral)  Resp 16  Ht 5\' 4"  (1.626 m)  Wt 120 lb (54.432 kg)  BMI 20.59 kg/m2  SpO2 100%  Physical Exam General: Well-developed, well-nourished female in no acute distress; appearance consistent with age of record HENT: normocephalic; atraumatic Eyes: pupils equal, round and reactive to light; extraocular muscles intact Neck: supple Heart: regular rate and rhythm; no  murmurs, rubs or gallops Lungs: clear to auscultation bilaterally Abdomen: soft; nondistended; nontender; no masses or hepatosplenomegaly; bowel sounds present Extremities: No deformity; full range of motion; pulses normal; generalized mild muscle tenderness; no edema Neurologic: Awake, alert and oriented; motor function intact in all extremities and symmetric; no facial droop Skin: Warm and dry Psychiatric: Normal mood and affect    ED Course  Procedures (including critical care time)    MDM   Nursing notes and vitals signs, including pulse oximetry, reviewed.  Summary of this visit's results, reviewed by myself:  Labs:  Results for orders placed during the hospital encounter of 10/09/13 (from the past 24 hour(s))  URINALYSIS, ROUTINE W REFLEX MICROSCOPIC     Status: None   Collection Time    10/09/13 12:13 AM      Result Value Range   Color, Urine YELLOW  YELLOW   APPearance  CLEAR  CLEAR   Specific Gravity, Urine 1.010  1.005 - 1.030   pH 7.5  5.0 - 8.0   Glucose, UA NEGATIVE  NEGATIVE mg/dL   Hgb urine dipstick NEGATIVE  NEGATIVE   Bilirubin Urine NEGATIVE  NEGATIVE   Ketones, ur NEGATIVE  NEGATIVE mg/dL   Protein, ur NEGATIVE  NEGATIVE mg/dL   Urobilinogen, UA 1.0  0.0 - 1.0 mg/dL   Nitrite NEGATIVE  NEGATIVE   Leukocytes, UA NEGATIVE  NEGATIVE  PREGNANCY, URINE     Status: None   Collection Time    10/09/13 12:13 AM      Result Value Range   Preg Test, Ur NEGATIVE  NEGATIVE  D-DIMER, QUANTITATIVE     Status: None   Collection Time    10/09/13 12:25 AM      Result Value Range   D-Dimer, Quant <0.27  0.00 - 0.48 ug/mL-FEU       Hanley Seamen, MD 10/09/13 [phone]

## 2013-10-10 NOTE — Telephone Encounter (Addendum)
Opened in error

## 2013-11-13 ENCOUNTER — Encounter: Payer: Self-pay | Admitting: Family

## 2013-11-13 ENCOUNTER — Other Ambulatory Visit: Payer: Self-pay | Admitting: Family

## 2013-11-13 NOTE — Telephone Encounter (Signed)
Left detailed message informing patient of medication refill and that she needs to be seen before further refills can be given. °

## 2013-11-13 NOTE — Telephone Encounter (Signed)
Pt was due for a 6 month f/u with Melissa in September and has no appts on file with us. Please let pt know we sent 30 days supply of birth control and she will need to be seen for follow up for futher refills.

## 2013-11-14 NOTE — Telephone Encounter (Signed)
Patient scheduled appointment for 11/17/13 °

## 2013-11-17 ENCOUNTER — Encounter: Payer: Self-pay | Admitting: Family

## 2013-11-17 ENCOUNTER — Ambulatory Visit (INDEPENDENT_AMBULATORY_CARE_PROVIDER_SITE_OTHER): Payer: PRIVATE HEALTH INSURANCE | Admitting: Family

## 2013-11-17 VITALS — BP 80/58 | HR 76 | Temp 98.6°F | Resp 16 | Ht 64.75 in | Wt 109.0 lb

## 2013-11-17 DIAGNOSIS — N946 Dysmenorrhea, unspecified: Secondary | ICD-10-CM

## 2013-11-17 DIAGNOSIS — R87629 Unspecified abnormal cytological findings in specimens from vagina: Secondary | ICD-10-CM

## 2013-11-17 DIAGNOSIS — F32A Depression, unspecified: Secondary | ICD-10-CM

## 2013-11-17 DIAGNOSIS — F329 Major depressive disorder, single episode, unspecified: Secondary | ICD-10-CM

## 2013-11-17 DIAGNOSIS — F3289 Other specified depressive episodes: Secondary | ICD-10-CM

## 2013-11-17 MED ORDER — NORETHIN ACE-ETH ESTRAD-FE 1.5-30 MG-MCG PO TABS: 1.0000 | ORAL_TABLET | Freq: Every day | ORAL | Status: DC

## 2013-11-17 NOTE — Progress Notes (Signed)
   Subjective:    Patient ID: Suzanne CousinJalen Mays, female    DOB: 01/25/1991, 23 y.o.   MRN: [id number]  HPI  Suzanne Mays is a 23 yr old female who presents today for follow up.   We discussed depression last 1 year ago at which time she was started on a trial of zoloft.  She reports that she took for 2 weeks.  Notes that she is stopped medication and is feeling well. Denies depression.   Dysmenorrhea- reports improved on ocp.   Abnormal pap- had cervical biopsy with Dr. Normand Sloopillard 11/16/12    Review of Systems    reports back/shoulder pain on Wednesday, resolved with tylenol.   No past medical history on file.  History   Social History  . Marital Status: Single    Spouse Name: N/A    Number of Children: N/A  . Years of Education: N/A   Occupational History  . Not on file.   Social History Main Topics  . Smoking status: Never Smoker   . Smokeless tobacco: Never Used  . Alcohol Use: No  . Drug Use: No  . Sexual Activity: Yes    Birth Control/ Protection: Pill     Comment: JUNEL    Other Topics Concern  . 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 Wisdom tooth extraction      Family History  Problem Relation Age of Onset  . Hypertension Mother   . Diabetes Mother   . Stroke Maternal Grandmother   . Cancer Maternal Grandmother     breast & brain    No Known Allergies  Current Outpatient Prescriptions on File Prior to Visit  Medication Sig Dispense Refill  . Multiple Vitamins-Minerals (CENTRUM PO) Take 1 tablet by mouth daily.       No current facility-administered medications on file prior to visit.    BP 80/58  Pulse 76  Temp(Src) 98.6 F (37 C) (Oral)  Resp 16  Ht 5' 4.75" (1.645 m)  Wt 109 lb  (49.442 kg)  BMI 18.27 kg/m2  SpO2 99%  LMP 10/27/2013    Objective:   Physical Exam  Constitutional: She is oriented to person, place, and time. She appears well-developed and well-nourished. No distress.  HENT:  Head: Normocephalic and atraumatic.  Cardiovascular: Normal rate and regular rhythm.   No murmur heard. Pulmonary/Chest: Effort normal and breath sounds normal. No respiratory distress. She has no wheezes. She has no rales. She exhibits no tenderness.  Neurological: She is alert and oriented to person, place, and time.  Psychiatric: She has a normal mood and affect. Her behavior is normal. Judgment and thought content normal.          Assessment & Plan:

## 2013-11-17 NOTE — Patient Instructions (Addendum)
Please schedule a follow up appointment with GYN- Dr. Normand Sloopillard. Schedule a physical at your convenience.

## 2013-11-17 NOTE — Assessment & Plan Note (Signed)
Improved on OCP 

## 2013-11-17 NOTE — Progress Notes (Signed)
Pre visit review using our clinic review tool, if applicable. No additional management support is needed unless otherwise documented below in the visit note. 

## 2013-11-17 NOTE — Assessment & Plan Note (Signed)
Stable off meds.  Monitor.  

## 2013-11-17 NOTE — Assessment & Plan Note (Signed)
Advised pt to schedule follow up with her GYN as she is past due for follow up pap.

## 2013-12-11 ENCOUNTER — Other Ambulatory Visit: Payer: Self-pay | Admitting: Family

## 2014-01-03 ENCOUNTER — Other Ambulatory Visit: Payer: Self-pay | Admitting: Family

## 2014-01-03 ENCOUNTER — Encounter: Payer: Self-pay | Admitting: Family

## 2014-01-04 ENCOUNTER — Other Ambulatory Visit: Payer: Self-pay | Admitting: Family

## 2014-01-04 MED ORDER — NORETHINDRONE ACET-ETHINYL EST 1.5-30 MG-MCG PO TABS: 1.0000 | ORAL_TABLET | Freq: Every day | ORAL | Status: DC

## 2014-03-02 ENCOUNTER — Other Ambulatory Visit: Payer: Self-pay | Admitting: Family Medicine

## 2014-03-02 NOTE — Telephone Encounter (Signed)
Received message from pt stating [facility] says they did not get previous Rx with refills x 1 year on her birth control. Sent refills to get pt through to first of the year. Notified pt.

## 2014-03-02 NOTE — Telephone Encounter (Signed)
Rx resent, pt has been made aware.

## 2014-08-05 ENCOUNTER — Emergency Department (HOSPITAL_BASED_OUTPATIENT_CLINIC_OR_DEPARTMENT_OTHER): Payer: PRIVATE HEALTH INSURANCE

## 2014-08-05 ENCOUNTER — Encounter (HOSPITAL_BASED_OUTPATIENT_CLINIC_OR_DEPARTMENT_OTHER): Payer: Self-pay | Admitting: Emergency Medicine

## 2014-08-05 ENCOUNTER — Emergency Department (HOSPITAL_BASED_OUTPATIENT_CLINIC_OR_DEPARTMENT_OTHER)
Admission: EM | Admit: 2014-08-05 | Discharge: 2014-08-05 | Disposition: A | Discharge status: Home / Self Care | Payer: PRIVATE HEALTH INSURANCE | Attending: Emergency Medicine | Admitting: Emergency Medicine

## 2014-08-05 DIAGNOSIS — Z3202 Encounter for pregnancy test, result negative: Secondary | ICD-10-CM | POA: Diagnosis not present

## 2014-08-05 DIAGNOSIS — Z79899 Other long term (current) drug therapy: Secondary | ICD-10-CM | POA: Insufficient documentation

## 2014-08-05 DIAGNOSIS — R002 Palpitations: Secondary | ICD-10-CM | POA: Insufficient documentation

## 2014-08-05 LAB — D-DIMER, QUANTITATIVE: D-Dimer, Quant: 0.42 ug/mL-FEU (ref 0.00–0.48)

## 2014-08-05 LAB — CBC WITH DIFFERENTIAL/PLATELET
Basophils Absolute: 0 10*3/uL (ref 0.0–0.1)
Basophils Relative: 0 % (ref 0–1)
EOS PCT: 3 % (ref 0–5)
Eosinophils Absolute: 0.2 10*3/uL (ref 0.0–0.7)
HEMATOCRIT: 37.5 % (ref 36.0–46.0)
Hemoglobin: 12.5 g/dL (ref 12.0–15.0)
LYMPHS ABS: 2.1 10*3/uL (ref 0.7–4.0)
Lymphocytes Relative: 32 % (ref 12–46)
MCH: 31.1 pg (ref 26.0–34.0)
MCHC: 33.3 g/dL (ref 30.0–36.0)
MCV: 93.3 fL (ref 78.0–100.0)
MONO ABS: 0.5 10*3/uL (ref 0.1–1.0)
Monocytes Relative: 8 % (ref 3–12)
Neutro Abs: 3.8 10*3/uL (ref 1.7–7.7)
Neutrophils Relative %: 57 % (ref 43–77)
Platelets: 337 10*3/uL (ref 150–400)
RBC: 4.02 MIL/uL (ref 3.87–5.11)
RDW: 12.3 % (ref 11.5–15.5)
WBC: 6.7 10*3/uL (ref 4.0–10.5)

## 2014-08-05 LAB — BASIC METABOLIC PANEL
Anion gap: 14 (ref 5–15)
BUN: 17 mg/dL (ref 6–23)
CALCIUM: 9.1 mg/dL (ref 8.4–10.5)
CO2: 23 meq/L (ref 19–32)
CREATININE: 0.7 mg/dL (ref 0.50–1.10)
Chloride: 102 mEq/L (ref 96–112)
GFR calc Af Amer: >90 mL/min (ref >90)
GFR calc non Af Amer: >90 mL/min (ref >90)
GLUCOSE: 91 mg/dL (ref 70–99)
Potassium: 3.9 mEq/L (ref 3.7–5.3)
Sodium: 139 mEq/L (ref 137–147)

## 2014-08-05 LAB — PREGNANCY, URINE: Preg Test, Ur: NEGATIVE

## 2014-08-05 LAB — TSH: TSH: 2.69 u[IU]/mL (ref 0.350–4.500)

## 2014-08-05 NOTE — ED Notes (Addendum)
Patient states she had trouble sleeping because she felt her heart racing last night. Had episodes of racing heart yesterday afternoon and continued throught the night

## 2014-08-05 NOTE — Discharge Instructions (Signed)
Not every illness or injury can be identified during an emergency department visit, thus follow-up with your primary healthcare provider is important. Medical conditions can also worsen, so it is also important to return immediately as directed below, or if you have other serious concerns develop. °RETURN IMMEDIATELY IF you develop new shortness of breath, chest pain, fever, have difficulty moving parts of your body (new weakness, numbness, or incoordination), sudden change in speech, vision, swallowing, or understanding, faint or develop new dizziness, severe headache, become poorly responsive or have an altered mental status compared to baseline for you, new rash, abdominal pain, or bloody stools,  °Return sooner also if you develop new problems for which you have not talked to your caregiver but you feel may be emergency medical conditions, or are unable to be cared for safely at home. °

## 2014-08-05 NOTE — ED Provider Notes (Signed)
CSN: [id number]     Arrival date & time 08/05/14  0901 History   First MD Initiated Contact with Patient 08/05/14 [phone]     Chief Complaint  Patient presents with  . Tachycardia     (Consider location/radiation/quality/duration/timing/severity/associated sxs/prior Treatment) HPI 23 year old female presents with about 18 hours of gradual onset mild palpitations feeling as if her heart is racing, she checked her pulse and the rhythm felt regular, but she did not know how to count her pulse rate, she feels slightly short of breath as well but has no fever and no cough no chest pain no syncope, she has no history of blood clots in her legs or lungs, she has no leg pain or leg swelling or recent travel or recent immobilization, she had insomnia overnight with mild anxiety but no depression no suicidal or homicidal ideation no hallucinations, no history of thyroid problems, her symptoms are mild with no treatment prior to arrival, she feels as if her heart is still racing with palpitations even though she has a normal sinus rhythm now in the ED, she does take oral birth control so PERC cannot be applied.  History reviewed. No pertinent past medical history. Past Surgical History  Procedure Laterality Date  . Wisdom tooth extraction     Family History  Problem Relation Age of Onset  . Hypertension Mother   . Diabetes Mother   . Stroke Maternal Grandmother   . Cancer Maternal Grandmother     breast & brain   History  Substance Use Topics  . Smoking status: Never Smoker   . Smokeless tobacco: Never Used  . Alcohol Use: No   OB History    Gravida Para Term Preterm AB TAB SAB Ectopic Multiple Living   0              Review of Systems 10 Systems reviewed and are negative for acute change except as noted in the HPI.   Allergies  Review of patient's allergies indicates no known allergies.  Home Medications   Prior to Admission medications   Medication Sig Start Date End Date Taking?  Authorizing Provider  Multiple Vitamins-Minerals (CENTRUM PO) Take 1 tablet by mouth daily.    Historical Provider, MD  norethindrone-ethinyl estradiol-iron (MICROGESTIN FE,GILDESS FE,LOESTRIN FE) 1.5-30 MG-MCG tablet Take 1 tablet by mouth daily. 11/17/13   Sandford CrazeMelissa O'Sullivan, NP   BP 100/67 mmHg  Pulse 81  Temp(Src) 97.9 F (36.6 C) (Oral)  Resp 18  Ht 5\' 4"  (1.626 m)  Wt 115 lb (52.164 kg)  BMI 19.73 kg/m2  SpO2 100% Physical Exam  Nursing note and vitals reviewed. Constitutional:  Awake, alert, nontoxic appearance.  HENT:  Head: Atraumatic.  Eyes: Right eye exhibits no discharge. Left eye exhibits no discharge.  Neck: Neck supple.  Cardiovascular: Normal rate and regular rhythm.   No murmur heard. Pulmonary/Chest: Effort normal and breath sounds normal. No respiratory distress. She has no wheezes. She has no rales. She exhibits no tenderness.  Pulse oximetry normal on room air 100%  Abdominal: Soft. She exhibits no distension. There is no tenderness. There is no rebound and no guarding.  Musculoskeletal: She exhibits no edema and no tenderness.  Baseline ROM, no obvious new focal weakness.  Neurological: She is alert.  Mental status and motor strength appears baseline for patient and situation.  Skin: No rash noted.  Psychiatric: She has a normal mood and affect.    ED Course  Procedures (including critical care time) Labs Review Labs Reviewed  PREGNANCY, URINE  CBC WITH DIFFERENTIAL  BASIC METABOLIC PANEL  TSH  D-DIMER, QUANTITATIVE    Imaging Review No results found.   EKG Interpretation   Date/Time:  Sunday August 05 2014 09:22:57 EDT Ventricular Rate:  65 PR Interval:  184 QRS Duration: 78 QT Interval:  360 QTC Calculation: 374 R Axis:   84 Text Interpretation:  Normal sinus rhythm with sinus arrhythmia Normal ECG  No previous ECGs available Confirmed by [facility]  MD, Jonny RuizJOHN ([id number]) on  08/05/2014 9:30:50 AM      MDM   Final diagnoses:   Palpitations    Patient / Family / Caregiver informed of clinical course, understand medical decision-making process, and agree with plan. I doubt any other EMC precluding discharge at this time including, but not necessarily limited to the following:PE, Vtach.    Hurman HornJohn M Karmine Kauer, MD 08/19/14 2018

## 2014-08-06 ENCOUNTER — Telehealth: Payer: Self-pay | Admitting: Family

## 2014-08-06 NOTE — Telephone Encounter (Signed)
pls contact pt to arrange ED follow up visit.

## 2014-08-06 NOTE — Telephone Encounter (Signed)
Appointment scheduled.

## 2014-08-07 ENCOUNTER — Ambulatory Visit (INDEPENDENT_AMBULATORY_CARE_PROVIDER_SITE_OTHER): Payer: PRIVATE HEALTH INSURANCE | Admitting: Physician Assistant

## 2014-08-07 ENCOUNTER — Encounter: Payer: Self-pay | Admitting: Family

## 2014-08-07 ENCOUNTER — Encounter: Payer: Self-pay | Admitting: Physician Assistant

## 2014-08-07 VITALS — BP 95/64 | HR 72 | Temp 99.1°F | Resp 16 | Ht 64.0 in | Wt 114.1 lb

## 2014-08-07 DIAGNOSIS — R0602 Shortness of breath: Secondary | ICD-10-CM

## 2014-08-07 DIAGNOSIS — Z72820 Sleep deprivation: Secondary | ICD-10-CM | POA: Insufficient documentation

## 2014-08-07 DIAGNOSIS — R002 Palpitations: Secondary | ICD-10-CM

## 2014-08-07 DIAGNOSIS — M940 Chondrocostal junction syndrome [Tietze]: Secondary | ICD-10-CM | POA: Insufficient documentation

## 2014-08-07 HISTORY — DX: Palpitations: R00.2

## 2014-08-07 HISTORY — DX: Chondrocostal junction syndrome (tietze): M94.0

## 2014-08-07 HISTORY — DX: Sleep deprivation: Z72.820

## 2014-08-07 NOTE — Assessment & Plan Note (Signed)
Encouraged OTC melatonin supplement to help ease into sleep.  Return precautions discussed with patient.

## 2014-08-07 NOTE — Progress Notes (Signed)
Patient presents to clinic today for ER follow-up of palpitations and some mild SOB experienced the other day.  ER workup including troponin, CBC, BMP, TSH, d-dimer, UA, urine pregnancy and EKG all unremarkable.  CXR also obtained and unremarkable.  Patient states she was told to follow-up with primary care.  Denies overt chest pain or SOB, but still having intermittent palpitations and some chest tenderness if she presses on her sternum.  Denies increased stress or anxiety.  Denies caffeine intake.  Still having some difficulty with sleep.  Has not taken anything for symptoms.  States she feels the palpitations presently.  No past medical history on file.  Current Outpatient Prescriptions on File Prior to Visit  Medication Sig Dispense Refill  . Multiple Vitamins-Minerals (CENTRUM PO) Take 1 tablet by mouth daily.      . norethindrone-ethinyl estradiol-iron (MICROGESTIN FE,GILDESS FE,LOESTRIN FE) 1.5-30 MG-MCG tablet Take 1 tablet by mouth daily.  1 Package  11   No current facility-administered medications on file prior to visit.    No Known Allergies  Family History  Problem Relation Age of Onset  . Hypertension Mother   . Diabetes Mother   . Stroke Maternal Grandmother   . Cancer Maternal Grandmother     breast & brain    History   Social History  . Marital Status: Single    Spouse Name: N/A    Number of Children: N/A  . Years of Education: N/A   Social History Main Topics  . Smoking status: Never Smoker   . Smokeless tobacco: Never Used  . Alcohol Use: No  . Drug Use: No  . Sexual Activity: Yes    Birth Control/ Protection: Pill     Comment: JUNEL    Other Topics Concern  . None   Social History Narrative   Journalism/media studies- [employer]   Lives with mom and 2 dogs   Enjoys sleeping in her spare time.    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  All other ROS  are negative.  BP 95/64  Pulse 72  Temp(Src) 99.1 F (37.3 C) (Oral)  Resp 16  Ht $R'5\' 4"'Wn$  (1.626 m)  Wt 114 lb 2 oz (51.767 kg)  BMI 19.58 kg/m2  SpO2 100%  Physical Exam  Vitals reviewed. Constitutional: She is oriented to person, place, and time and well-developed, well-nourished, and in no distress.  HENT:  Head: Normocephalic and atraumatic.  Eyes: Conjunctivae are normal. Pupils are equal, round, and reactive to light.  Neck: Neck supple. No thyromegaly present.  Cardiovascular: Normal rate, regular rhythm, normal heart sounds and intact distal pulses.   Pulmonary/Chest: Effort normal and breath sounds normal. No respiratory distress. She has no wheezes. She has no rales.  Tenderness with palpation over the sternum, consistent with costochondritis.  Lymphadenopathy:    She has no cervical adenopathy.  Neurological: She is alert and oriented to person, place, and time.  Skin: Skin is warm and dry. No rash noted.  Psychiatric: Affect normal.    Recent Results (from the past 2160 hour(s))  PREGNANCY, URINE     Status: None   Collection Time    08/05/14  9:31 AM      Result Value Ref Range   Preg Test, Ur NEGATIVE  NEGATIVE   Comment:            THE SENSITIVITY OF  THIS     METHODOLOGY IS >20 mIU/mL.  CBC WITH DIFFERENTIAL     Status: None   Collection Time    08/05/14  9:40 AM      Result Value Ref Range   WBC 6.7  4.0 - 10.5 K/uL   RBC 4.02  3.87 - 5.11 MIL/uL   Hemoglobin 12.5  12.0 - 15.0 g/dL   HCT 44.4  58.4 - 83.5 %   MCV 93.3  78.0 - 100.0 fL   MCH 31.1  26.0 - 34.0 pg   MCHC 33.3  30.0 - 36.0 g/dL   RDW 07.5  73.2 - 25.6 %   Platelets 337  150 - 400 K/uL   Neutrophils Relative % 57  43 - 77 %   Neutro Abs 3.8  1.7 - 7.7 K/uL   Lymphocytes Relative 32  12 - 46 %   Lymphs Abs 2.1  0.7 - 4.0 K/uL   Monocytes Relative 8  3 - 12 %   Monocytes Absolute 0.5  0.1 - 1.0 K/uL   Eosinophils Relative 3  0 - 5 %   Eosinophils Absolute 0.2  0.0 - 0.7 K/uL    Basophils Relative 0  0 - 1 %   Basophils Absolute 0.0  0.0 - 0.1 K/uL  BASIC METABOLIC PANEL     Status: None   Collection Time    08/05/14  9:40 AM      Result Value Ref Range   Sodium 139  137 - 147 mEq/L   Potassium 3.9  3.7 - 5.3 mEq/L   Chloride 102  96 - 112 mEq/L   CO2 23  19 - 32 mEq/L   Glucose, Bld 91  70 - 99 mg/dL   BUN 17  6 - 23 mg/dL   Creatinine, Ser 7.20  0.50 - 1.10 mg/dL   Calcium 9.1  8.4 - 91.9 mg/dL   GFR calc non Af Amer >90  >90 mL/min   GFR calc Af Amer >90  >90 mL/min   Comment: (NOTE)     The eGFR has been calculated using the CKD EPI equation.     This calculation has not been validated in all clinical situations.     eGFR's persistently <90 mL/min signify possible Chronic Kidney     Disease.   Anion gap 14  5 - 15  TSH     Status: None   Collection Time    08/05/14  9:40 AM      Result Value Ref Range   TSH 2.690  0.350 - 4.500 uIU/mL   Comment: Performed at [facility]  D-DIMER, QUANTITATIVE     Status: None   Collection Time    08/05/14  9:40 AM      Result Value Ref Range   D-Dimer, Quant 0.42  0.00 - 0.48 ug/mL-FEU   Comment:            AT THE INHOUSE ESTABLISHED CUTOFF     VALUE OF 0.48 ug/mL FEU,     THIS ASSAY HAS BEEN DOCUMENTED     IN THE LITERATURE TO HAVE     A SENSITIVITY AND NEGATIVE     PREDICTIVE VALUE OF AT LEAST     98 TO 99%.  THE TEST RESULT     SHOULD BE CORRELATED WITH     AN ASSESSMENT OF THE CLINICAL     PROBABILITY OF DVT / VTE.   Assessment/Plan: Palpitations Pulse normal.  BP within normal limits.  EKG obtained revealing NSR.  Will refer to Cardiology for assessment.  Order for holter monitor placed.  Do feel there is a stress component but patient denies.  Avoid strenuous activity.  Return precautions discussed with patient.  Costochondritis Daily aleve.  Avoid heavy lifting or overexertion.  Sleep deficient Encouraged OTC melatonin supplement to help ease into sleep.  Return precautions discussed  with patient.

## 2014-08-07 NOTE — Patient Instructions (Signed)
Your examination and EKG look good.  No concerning findings today.  I ave set you up with Cardiology.  You will be contacted for a Holter Monitor placement and evaluation.  The chest discomfort seems musculoskeletal -- Take a daily Aleve. Avoid heavy lifting.  For sleep -- take Melatonin at bedtime to see if sleep improves.  Try to take time for yourself to do something fun daily.  If symptoms acutely worsen, please proceed to ER.

## 2014-08-07 NOTE — Assessment & Plan Note (Signed)
Daily aleve.  Avoid heavy lifting or overexertion.

## 2014-08-07 NOTE — Assessment & Plan Note (Signed)
Pulse normal.  BP within normal limits. EKG obtained revealing NSR.  Will refer to Cardiology for assessment.  Order for holter monitor placed.  Do feel there is a stress component but patient denies.  Avoid strenuous activity.  Return precautions discussed with patient.

## 2014-08-07 NOTE — Progress Notes (Signed)
Pre visit review using our clinic review tool, if applicable. No additional management support is needed unless otherwise documented below in the visit note/SLS  

## 2014-08-07 NOTE — Addendum Note (Signed)
Addended by: Marcelline MatesMARTIN, Shelbee Apgar on: 08/07/2014 04:52 PM   Modules accepted: Orders

## 2014-08-10 ENCOUNTER — Telehealth: Payer: Self-pay | Admitting: *Deleted

## 2014-08-10 NOTE — Telephone Encounter (Signed)
Message copied by Kathi SimpersFERGERSON, Myna Freimark A on Fri Aug 10, 2014  7:46 AM ------      Message from: Oneal GroutSEBASTIAN, JENNIFER S      Created: Tue Aug 07, 2014  4:57 PM      Regarding: FW: monitor        He changed the order. Thanks      ----- Message -----         From: Waldon MerlWilliam C Martin, PA-C         Sent: 08/07/2014   4:52 PM           To: Oneal GroutJennifer S Sebastian      Subject: RE: monitor                                              Done.  Thanks.      ----- Message -----         From: Oneal GroutJennifer S Sebastian         Sent: 08/07/2014   3:55 PM           To: Waldon MerlWilliam C Martin, PA-C      Subject: FW: monitor                                              Good Afternoon... Can you please change to 48hr holter, they do not do 72hr. Thanks      ----- Message -----         From: Pricilla HolmSharon B Ferguson         Sent: 08/07/2014   3:25 PM           To: Tillman SersJennifer S Sebastian, Sharon B Ferguson      Subject: monitor                                                  We only do 48 hr holter monitor/and the order would need to be change to 48 hr holter/once done I will schedule.            Thanks.       ----- Message -----         From: Oneal GroutJennifer S Sebastian         Sent: 08/07/2014   1:09 PM           To: Doyle AskewSharon B Ferguson            Good Afternoon!      There is a order for a 72 hr holter, do you guys do that or is it just 48hr? If so, can you please schedule?       Thanks                         ------

## 2014-08-15 ENCOUNTER — Encounter (INDEPENDENT_AMBULATORY_CARE_PROVIDER_SITE_OTHER): Payer: PRIVATE HEALTH INSURANCE

## 2014-08-15 ENCOUNTER — Encounter: Payer: Self-pay | Admitting: *Deleted

## 2014-08-15 DIAGNOSIS — R002 Palpitations: Secondary | ICD-10-CM

## 2014-08-15 NOTE — Progress Notes (Signed)
Patient ID: Suzanne CousinJalen Mays, female   DOB: 01/16/1991, 23 y.o.   MRN: [id number] [facility] 48 hour holter monitor applied to patient.

## 2014-08-22 ENCOUNTER — Telehealth: Payer: Self-pay | Admitting: *Deleted

## 2014-08-22 NOTE — Telephone Encounter (Signed)
Pt notified of normal 48 hour holter monitor results per Dr Delton SeeNelson.  Pt verbalized understanding.

## 2014-09-07 ENCOUNTER — Encounter: Payer: Self-pay | Admitting: Cardiology

## 2014-09-07 ENCOUNTER — Ambulatory Visit (INDEPENDENT_AMBULATORY_CARE_PROVIDER_SITE_OTHER): Payer: PRIVATE HEALTH INSURANCE | Admitting: Cardiology

## 2014-09-07 VITALS — BP 120/60 | HR 59 | Ht 64.0 in | Wt 116.0 lb

## 2014-09-07 DIAGNOSIS — I491 Atrial premature depolarization: Secondary | ICD-10-CM

## 2014-09-07 DIAGNOSIS — I493 Ventricular premature depolarization: Secondary | ICD-10-CM

## 2014-09-07 DIAGNOSIS — R002 Palpitations: Secondary | ICD-10-CM

## 2014-09-07 NOTE — Patient Instructions (Signed)
Your physician recommends that you continue on your current medications as directed. Please refer to the Current Medication list given to you today.   Your physician has requested that you have an echocardiogram. Echocardiography is a painless test that uses sound waves to create images of your heart. It provides your doctor with information about the size and shape of your heart and how well your heart's chambers and valves are working. This procedure takes approximately one hour. There are no restrictions for this procedure.    Your physician recommends that you schedule a follow-up appointment in: AS NEEDED WITH DR NELSON  

## 2014-09-07 NOTE — Progress Notes (Signed)
Patient ID: Suzanne Mays, female   DOB: 01/11/1991, 23 y.o.   MRN: [id number]    Patient Name: Suzanne Mays Date of Encounter: 09/07/2014  Primary Care Provider:  Lemont Fillers'SULLIVAN,MELISSA S., NP Primary Cardiologist: Lars MassonNELSON, Yon Schiffman H  Problem List   No past medical history on file. Past Surgical History  Procedure Laterality Date  . 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 The patient experienced tachypalpitations approximately months ago that were accompanied by dizziness and shortness of breath. No prior history of syncope. Patient states that she was not going through any stressful period at that time. She denies use of any drugs, drinks caffeine only occasionally and was not doing anything unusual at that time. She was more aware of palpitations at night. The patient states that both of her maternal grand father and her mother were diagnosed with SVT in the past. Patient's mother who is present today is followed by Dr. Ladona Ridgelaylor for SVT. On one occasion she needed termination with adenosine in the hospital. She was referred for an ablation but hasn't decided about it yet. They otherwise don't have any history of heart disease in their family, specifically no history history of premature coronary artery disease and no history of sudden cardiac death. The patient doesn't smoke she is fairly active and denies any other symptoms such as chest pain, dyspnea on exertion, lower extremity edema, orthopnea or paroxysmal nocturnal dyspnea.  Home Medications  Prior to Admission medications   Medication Sig Start Date End Date Taking? Authorizing Provider  Multiple Vitamins-Minerals (CENTRUM PO) Take 1 tablet by mouth daily.   Yes Historical Provider, MD  norethindrone-ethinyl estradiol-iron (MICROGESTIN FE,GILDESS FE,LOESTRIN FE) 1.5-30 MG-MCG tablet Take 1 tablet  by mouth daily. 11/17/13  Yes Sandford CrazeMelissa O'Sullivan, NP    Family History  Family History  Problem Relation Age of Onset  . Hypertension Mother   . Diabetes Mother   . Stroke Maternal Grandmother   . Cancer Maternal Grandmother     breast & brain    Social History  History   Social History  . Marital Status: Single    Spouse Name: N/A    Number of Children: N/A  . Years of Education: N/A   Occupational History  . Not on file.   Social History Main Topics  . Smoking status: Never Smoker   . Smokeless tobacco: Never Used  . Alcohol Use: No  . Drug Use: No  . Sexual Activity: Yes    Birth Control/ Protection: Pill     Comment: JUNEL    Other Topics Concern  . 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  Cardiovascular:  No chest pain, dyspnea on exertion, edema, orthopnea, palpitations, paroxysmal nocturnal dyspnea. Dermatological: No rash, lesions/masses Respiratory: No cough, dyspnea Urologic: No hematuria, dysuria Abdominal:   No nausea, vomiting, diarrhea, bright red blood per rectum, melena, or hematemesis Neurologic:  No visual changes, wkns, changes in mental status. All other systems reviewed and are otherwise  negative except as noted above.  Physical Exam  Blood pressure 120/60, pulse 59, height 5\' 4"  (1.626 m), weight 116 lb (52.617 kg).  General: Pleasant, NAD Psych: Normal affect. Neuro: Alert and oriented X 3. Moves all extremities spontaneously. HEENT: Normal  Neck: Supple without bruits or JVD. Lungs:  Resp regular and unlabored, CTA. Heart: RRR no s3, s4, or murmurs. Abdomen: Soft, non-tender, non-distended, BS + x 4.    Extremities: No clubbing, cyanosis or edema. DP/PT/Radials 2+ and equal bilaterally.  Labs:  No results for input(s): CKTOTAL, CKMB, TROPONINI in the last 72 hours. Lab Results  Component Value Date   WBC 6.7 08/05/2014   HGB 12.5 08/05/2014   HCT 37.5 08/05/2014   MCV 93.3 08/05/2014   PLT 337 08/05/2014    Lab Results  Component Value Date   DDIMER 0.42 08/05/2014   Invalid input(s): POCBNP    Component Value Date/Time   NA 139 08/05/2014 0940   K 3.9 08/05/2014 0940   CL 102 08/05/2014 0940   CO2 23 08/05/2014 0940   GLUCOSE 91 08/05/2014 0940   BUN 17 08/05/2014 0940   CREATININE 0.70 08/05/2014 0940   CREATININE 0.77 09/07/2012 1533   CALCIUM 9.1 08/05/2014 0940   PROT 7.4 09/07/2012 1533   ALBUMIN 4.1 09/07/2012 1533   AST 14 09/07/2012 1533   ALT <8 09/07/2012 1533   ALKPHOS 32* 09/07/2012 1533   BILITOT 0.2* 09/07/2012 1533   GFRNONAA >90 08/05/2014 0940   GFRNONAA >89 09/07/2012 1533   GFRAA >90 08/05/2014 0940   GFRAA >89 09/07/2012 1533   Lab Results  Component Value Date   CHOL 187 09/07/2012   HDL 47 09/07/2012   LDLCALC 130* 09/07/2012   TRIG 49 09/07/2012    Accessory Clinical Findings  Echocardiogram - none  ECG - sinus bradycardia with sinus arrhythmia, otherwise normal EKG.     Assessment & Plan  A very pleasant young female with tachycardia palpitations. Her EKG shows sinus bradycardia with sinus arrhythmia and is otherwise normal. Her labs are unremarkable including electrolytes kidney, liver function and TSH. I have reviewed her Holter monitor that shows only few PACs and PVCs no other arrhythmias. Considering her family history of SVT we will order an echocardiogram to rule out any structural heart disease. At this point we don't have an evidence for arrhythmia she had if any. We will follow with patient after echocardiogram is completely normal we will follow as needed for another episodes of tachypalpitations.   Lars MassonNELSON, Edrick Whitehorn  H, MD, [facility] 09/07/2014, 11:47 AM

## 2014-09-12 ENCOUNTER — Ambulatory Visit (HOSPITAL_COMMUNITY): Payer: No Typology Code available for payment source | Attending: Cardiology | Admitting: Radiology

## 2014-09-12 DIAGNOSIS — R002 Palpitations: Secondary | ICD-10-CM | POA: Insufficient documentation

## 2014-09-12 NOTE — Progress Notes (Signed)
Echocardiogram performed.  

## 2014-09-14 ENCOUNTER — Telehealth: Payer: Self-pay | Admitting: *Deleted

## 2014-09-14 NOTE — Telephone Encounter (Signed)
Follow up  ° ° ° °Returning call back to nurse  °

## 2014-09-14 NOTE — Telephone Encounter (Signed)
-----   Message from Lars MassonKatarina H Nelson, MD sent at 09/13/2014  3:38 PM EST ----- Normal echocardiogram

## 2014-09-14 NOTE — Telephone Encounter (Signed)
lmtcb Debbie Darnetta Kesselman RN  

## 2014-09-14 NOTE — Telephone Encounter (Signed)
Pt aware of echo results Debbie Crista Nuon RN  

## 2015-02-15 ENCOUNTER — Ambulatory Visit: Payer: No Typology Code available for payment source | Admitting: Family

## 2015-07-25 ENCOUNTER — Emergency Department (HOSPITAL_BASED_OUTPATIENT_CLINIC_OR_DEPARTMENT_OTHER)
Admission: EM | Admit: 2015-07-25 | Discharge: 2015-07-25 | Disposition: A | Discharge status: Home / Self Care | Payer: No Typology Code available for payment source | Attending: Emergency Medicine | Admitting: Emergency Medicine

## 2015-07-25 ENCOUNTER — Encounter (HOSPITAL_BASED_OUTPATIENT_CLINIC_OR_DEPARTMENT_OTHER): Payer: Self-pay | Admitting: *Deleted

## 2015-07-25 DIAGNOSIS — Z79899 Other long term (current) drug therapy: Secondary | ICD-10-CM | POA: Diagnosis not present

## 2015-07-25 DIAGNOSIS — J069 Acute upper respiratory infection, unspecified: Secondary | ICD-10-CM | POA: Insufficient documentation

## 2015-07-25 DIAGNOSIS — R0981 Nasal congestion: Secondary | ICD-10-CM | POA: Diagnosis present

## 2015-07-25 MED ORDER — FLUTICASONE PROPIONATE 50 MCG/ACT NA SUSP: NASAL | Status: DC

## 2015-07-25 NOTE — ED Notes (Signed)
Cough fever and headache x 2 days. She has been taking OTC medications.

## 2015-07-25 NOTE — ED Provider Notes (Signed)
CSN: [id number]     Arrival date & time 07/25/15  1751 History  By signing my name below, I, Suzanne Mays, attest that this documentation has been prepared under the direction and in the presence of Rolland Porter, MD.  Electronically Signed: Gwenyth Mays, ED Scribe. 07/25/2015. 6:19 PM.   Chief Complaint  Patient presents with  . URI   The history is provided by the patient and a parent. No language interpreter was used.    HPI Comments: Suzanne Mays is a 24 y.o. female who presents to the Emergency Department complaining of constant, moderate nasal congestion that started 2 days ago. She states nasal congestion with little nasal discharge, subjective fever, fatigue, HA, sinus pressure and decreased appetite as associated symptoms. Pt also notes increased difficulty breathing that occurs with lying down. Pt has tried Zyrtec with no relief and Benadryl with some improvement to her symptoms. She denies sore throat and SOB.  History reviewed. No pertinent past medical history. Past Surgical History  Procedure Laterality Date  . Wisdom tooth extraction     Family History  Problem Relation Age of Onset  . Hypertension Mother   . Diabetes Mother   . Stroke Maternal Grandmother   . Cancer Maternal Grandmother     breast & brain   Social History  Substance Use Topics  . Smoking status: Never Smoker   . Smokeless tobacco: Never Used  . Alcohol Use: No   OB History    Gravida Para Term Preterm AB TAB SAB Ectopic Multiple Living   0              Review of Systems  Constitutional: Positive for fever (subjective), appetite change and fatigue. Negative for chills and diaphoresis.  HENT: Positive for congestion and sinus pressure. Negative for mouth sores, sore throat and trouble swallowing.   Eyes: Negative for visual disturbance.  Respiratory: Negative for cough, chest tightness, shortness of breath and wheezing.   Cardiovascular: Negative for chest pain.  Gastrointestinal: Negative  for nausea, vomiting, abdominal pain, diarrhea and abdominal distention.  Endocrine: Negative for polydipsia, polyphagia and polyuria.  Genitourinary: Negative for dysuria, frequency and hematuria.  Musculoskeletal: Negative for gait problem.  Skin: Negative for color change, pallor and rash.  Neurological: Positive for headaches. Negative for dizziness, syncope and light-headedness.  Hematological: Does not bruise/bleed easily.  Psychiatric/Behavioral: Negative for behavioral problems and confusion.  All other systems reviewed and are negative.  Allergies  Review of patient's allergies indicates no known allergies.  Home Medications   Prior to Admission medications   Medication Sig Start Date End Date Taking? Authorizing Provider  fluticasone Aleda Grana) 50 MCG/ACT nasal spray 1 spray each nares bid 07/25/15   Rolland Porter, MD  Multiple Vitamins-Minerals (CENTRUM PO) Take 1 tablet by mouth daily.    Historical Provider, MD  norethindrone-ethinyl estradiol-iron (MICROGESTIN FE,GILDESS FE,LOESTRIN FE) 1.5-30 MG-MCG tablet Take 1 tablet by mouth daily. 11/17/13   Sandford Craze, NP   BP 95/60 mmHg  Pulse 85  Temp(Src) 98.4 F (36.9 C) (Oral)  Resp 18  Ht  (1.626 m)  Wt 115 lb (52.164 kg)  BMI 19.73 kg/m2  SpO2 100% Physical Exam  Constitutional: She is oriented to person, place, and time. She appears well-developed and well-nourished. No distress.  HENT:  Head: Normocephalic.  Right Ear: External ear normal.  Mouth/Throat: Oropharynx is clear and moist. No oropharyngeal exudate.  Nasal congestion  Eyes: Conjunctivae are normal. Pupils are equal, round, and reactive to light. No scleral  icterus.  Neck: Normal range of motion. Neck supple. No thyromegaly present.  Cardiovascular: Normal rate, regular rhythm and normal heart sounds.  Exam reveals no gallop and no friction rub.   No murmur heard. Pulmonary/Chest: Effort normal and breath sounds normal. No respiratory distress.  She has no wheezes. She has no rales.  Abdominal: Soft. Bowel sounds are normal. She exhibits no distension. There is no tenderness. There is no rebound.  Musculoskeletal: Normal range of motion.  Lymphadenopathy:    She has no cervical adenopathy.  Neurological: She is alert and oriented to person, place, and time.  Skin: Skin is warm and dry. No rash noted.  Psychiatric: She has a normal mood and affect. Her behavior is normal.  Nursing note and vitals reviewed.   ED Course  Procedures   DIAGNOSTIC STUDIES: Oxygen Saturation is 100% on RA, normal by my interpretation.    COORDINATION OF CARE: 6:15 PM Discussed suspicion for viral illness. Discussed treatment plan with pt which includes rest and Flonase. Pt agreed to plan.  MDM   Final diagnoses:  URI (upper respiratory infection)    I personally performed the services described in this documentation, which was scribed in my presence. The recorded information has been reviewed and is accurate.     Rolland Porter, MD 07/25/15 [phone]

## 2015-07-25 NOTE — Discharge Instructions (Signed)
Zyrtec or Benadryl for excess secretions. Psudofed in the morning to decongest your nose. Flonase, also to decongest your nose.  Upper Respiratory Infection, Adult Most upper respiratory infections (URIs) are a viral infection of the air passages leading to the lungs. A URI affects the nose, throat, and upper air passages. The most common type of URI is nasopharyngitis and is typically referred to as "the common cold." URIs run their course and usually go away on their own. Most of the time, a URI does not require medical attention, but sometimes a bacterial infection in the upper airways can follow a viral infection. This is called a secondary infection. Sinus and middle ear infections are common types of secondary upper respiratory infections. Bacterial pneumonia can also complicate a URI. A URI can worsen asthma and chronic obstructive pulmonary disease (COPD). Sometimes, these complications can require emergency medical care and may be life threatening.  CAUSES Almost all URIs are caused by viruses. A virus is a type of germ and can spread from one person to another.  RISKS FACTORS You may be at risk for a URI if:   You smoke.   You have chronic heart or lung disease.  You have a weakened defense (immune) system.   You are very young or very old.   You have nasal allergies or asthma.  You work in crowded or poorly ventilated areas.  You work in health care facilities or schools. SIGNS AND SYMPTOMS  Symptoms typically develop 2-3 days after you come in contact with a cold virus. Most viral URIs last 7-10 days. However, viral URIs from the influenza virus (flu virus) can last 14-18 days and are typically more severe. Symptoms may include:   Runny or stuffy (congested) nose.   Sneezing.   Cough.   Sore throat.   Headache.   Fatigue.   Fever.   Loss of appetite.   Pain in your forehead, behind your eyes, and over your cheekbones (sinus pain).  Muscle aches.   DIAGNOSIS  Your health care provider may diagnose a URI by:  Physical exam.  Tests to check that your symptoms are not due to another condition such as:  Strep throat.  Sinusitis.  Pneumonia.  Asthma. TREATMENT  A URI goes away on its own with time. It cannot be cured with medicines, but medicines may be prescribed or recommended to relieve symptoms. Medicines may help:  Reduce your fever.  Reduce your cough.  Relieve nasal congestion. HOME CARE INSTRUCTIONS   Take medicines only as directed by your health care provider.   Gargle warm saltwater or take cough drops to comfort your throat as directed by your health care provider.  Use a warm mist humidifier or inhale steam from a shower to increase air moisture. This may make it easier to breathe.  Drink enough fluid to keep your urine clear or pale yellow.   Eat soups and other clear broths and maintain good nutrition.   Rest as needed.   Return to work when your temperature has returned to normal or as your health care provider advises. You may need to stay home longer to avoid infecting others. You can also use a face mask and careful hand washing to prevent spread of the virus.  Increase the usage of your inhaler if you have asthma.   Do not use any tobacco products, including cigarettes, chewing tobacco, or electronic cigarettes. If you need help quitting, ask your health care provider. PREVENTION  The best way to protect  yourself from getting a cold is to practice good hygiene.   Avoid oral or hand contact with people with cold symptoms.   Wash your hands often if contact occurs.  There is no clear evidence that vitamin C, vitamin E, echinacea, or exercise reduces the chance of developing a cold. However, it is always recommended to get plenty of rest, exercise, and practice good nutrition.  SEEK MEDICAL CARE IF:   You are getting worse rather than better.   Your symptoms are not controlled by  medicine.   You have chills.  You have worsening shortness of breath.  You have brown or red mucus.  You have yellow or brown nasal discharge.  You have pain in your face, especially when you bend forward.  You have a fever.  You have swollen neck glands.  You have pain while swallowing.  You have white areas in the back of your throat. SEEK IMMEDIATE MEDICAL CARE IF:   You have severe or persistent:  Headache.  Ear pain.  Sinus pain.  Chest pain.  You have chronic lung disease and any of the following:  Wheezing.  Prolonged cough.  Coughing up blood.  A change in your usual mucus.  You have a stiff neck.  You have changes in your:  Vision.  Hearing.  Thinking.  Mood. MAKE SURE YOU:   Understand these instructions.  Will watch your condition.  Will get help right away if you are not doing well or get worse.   This information is not intended to replace advice given to you by your health care provider. Make sure you discuss any questions you have with your health care provider.   Document Released: 03/31/2001 Document Revised: 02/19/2015 Document Reviewed: 01/10/2014 Elsevier Interactive Patient Education [employer]2016 Elsevier Inc.

## 2015-12-01 ENCOUNTER — Encounter (HOSPITAL_BASED_OUTPATIENT_CLINIC_OR_DEPARTMENT_OTHER): Payer: Self-pay | Admitting: Emergency Medicine

## 2015-12-01 ENCOUNTER — Emergency Department (HOSPITAL_BASED_OUTPATIENT_CLINIC_OR_DEPARTMENT_OTHER)
Admission: EM | Admit: 2015-12-01 | Discharge: 2015-12-01 | Disposition: A | Discharge status: Home / Self Care | Payer: No Typology Code available for payment source | Attending: Emergency Medicine | Admitting: Emergency Medicine

## 2015-12-01 DIAGNOSIS — W57XXXA Bitten or stung by nonvenomous insect and other nonvenomous arthropods, initial encounter: Secondary | ICD-10-CM | POA: Diagnosis not present

## 2015-12-01 DIAGNOSIS — S80862A Insect bite (nonvenomous), left lower leg, initial encounter: Secondary | ICD-10-CM | POA: Diagnosis present

## 2015-12-01 DIAGNOSIS — Z7951 Long term (current) use of inhaled steroids: Secondary | ICD-10-CM | POA: Diagnosis not present

## 2015-12-01 DIAGNOSIS — L03116 Cellulitis of left lower limb: Secondary | ICD-10-CM | POA: Diagnosis not present

## 2015-12-01 DIAGNOSIS — Z793 Long term (current) use of hormonal contraceptives: Secondary | ICD-10-CM | POA: Diagnosis not present

## 2015-12-01 DIAGNOSIS — Y9222 Religious institution as the place of occurrence of the external cause: Secondary | ICD-10-CM | POA: Diagnosis not present

## 2015-12-01 DIAGNOSIS — Y998 Other external cause status: Secondary | ICD-10-CM | POA: Diagnosis not present

## 2015-12-01 DIAGNOSIS — Y9389 Activity, other specified: Secondary | ICD-10-CM | POA: Insufficient documentation

## 2015-12-01 MED ORDER — CEPHALEXIN 500 MG PO CAPS: 500.0000 mg | ORAL_CAPSULE | Freq: Four times a day (QID) | ORAL | Status: DC

## 2015-12-01 NOTE — ED Provider Notes (Signed)
CSN: [id number]     Arrival date & time 12/01/15  1639 History   First MD Initiated Contact with Patient 12/01/15 1824     Chief Complaint  Patient presents with  . Insect Bite   The history is provided by the patient.    Suzanne Mays is a 25 year old female presenting with a bug bite. She reports being at church yesterday wearing long pants and she felt a sharp bite to her left posterior calf. She states that it started aching and the skin around it turned red. She reports waking this morning with increased redness around the area, increased pain and swelling of the posterior calf. She states that ambulating exacerbates the pain in the calf. She has not tried any over-the-counter pain relievers. Nothing alleviates the pain. She denies drainage from the bug bite. She denies systemic symptoms including fevers, chills, headache, dizziness, myalgias, arthralgias, abdominal pain, nausea or vomiting. Denies recent time spent in wooded areas. She denies other bug bites. She has no other complaints today.  History reviewed. No pertinent past medical history. Past Surgical History  Procedure Laterality Date  . Wisdom tooth extraction     Family History  Problem Relation Age of Onset  . Hypertension Mother   . Diabetes Mother   . Stroke Maternal Grandmother   . Cancer Maternal Grandmother     breast & brain   Social History  Substance Use Topics  . Smoking status: Never Smoker   . Smokeless tobacco: Never Used  . Alcohol Use: No   OB History    Gravida Para Term Preterm AB TAB SAB Ectopic Multiple Living   0              Review of Systems  Skin: Positive for color change.       Bug bite  All other systems reviewed and are negative.     Allergies  Review of patient's allergies indicates no known allergies.  Home Medications   Prior to Admission medications   Medication Sig Start Date End Date Taking? Authorizing Provider  cephALEXin (KEFLEX) 500 MG capsule Take 1 capsule (500 mg  total) by mouth 4 (four) times daily. 12/01/15   Rolm Gala Jupiter Boys, PA-C  fluticasone (FLONASE) 50 MCG/ACT nasal spray 1 spray each nares bid 07/25/15   Rolland Porter, MD  Multiple Vitamins-Minerals (CENTRUM PO) Take 1 tablet by mouth daily.    Historical Provider, MD  norethindrone-ethinyl estradiol-iron (MICROGESTIN FE,GILDESS FE,LOESTRIN FE) 1.5-30 MG-MCG tablet Take 1 tablet by mouth daily. 11/17/13   Sandford Craze, NP   BP 95/67 mmHg  Pulse 86  Temp(Src) 98.3 F (36.8 C) (Oral)  Resp 15  Ht  (1.626 m)  Wt 49.896 kg  BMI 18.87 kg/m2  SpO2 98% Physical Exam  Constitutional: She appears well-developed and well-nourished. No distress.  Nontoxic appearing  HENT:  Head: Normocephalic and atraumatic.  Eyes: Conjunctivae are normal. Right eye exhibits no discharge. Left eye exhibits no discharge. No scleral icterus.  Neck: Normal range of motion.  Cardiovascular: Normal rate and regular rhythm.   Pulmonary/Chest: Effort normal. No respiratory distress.  Musculoskeletal: Normal range of motion.  FROM of knee and ankle. Pt is able to ambulate with a steady gait.   Neurological: She is alert. Coordination normal.  Skin: Skin is warm and dry. There is erythema.  Small bug bite with single pinpoint pustule noted to posterior left calf. Small amount of surrounding induration. No fluctuance or drainage. Overlying erythema localized to site of bite. No  streaking. Area is mildly TTP. No desquamation, vesicles, papules, bullae or target lesions noted.   Psychiatric: She has a normal mood and affect. Her behavior is normal.  Nursing note and vitals reviewed.   ED Course  Procedures (including critical care time) Labs Review Labs Reviewed - No data to display  Imaging Review No results found. I have personally reviewed and evaluated these images and lab results as part of my medical decision-making.   EKG Interpretation None      MDM   Final diagnoses:  Cellulitis of left lower  extremity   Pt presenting with skin rash consistent with cellulitis. Localized area of erythema without streaking, papules, vesicles or desquamation. Pt is without gross abscess for which I&D would be possible.  Area marked and pt encouraged to return if redness begins to streak, extends beyond the markings, fever or nausea/vomiting develop.  Pt is alert, oriented, NAD, afebrile, non tachycardic, nonseptic and nontoxic appearing. Pt to be d/c on keflex with instruction to follow up with her PCP in 2-3 for a skin check. Return precautions given in discharge paperwork and discussed with pt at bedside. Pt stable for discharge      Alveta Heimlich, PA-C 12/01/15 2006  Lyndal Pulley, MD 12/02/15 [phone]

## 2015-12-01 NOTE — ED Notes (Signed)
Patient has noted area of swelling to her left calf, appears to be a bug bite

## 2015-12-01 NOTE — Discharge Instructions (Signed)

## 2015-12-11 ENCOUNTER — Ambulatory Visit (INDEPENDENT_AMBULATORY_CARE_PROVIDER_SITE_OTHER): Payer: No Typology Code available for payment source | Admitting: Family

## 2015-12-11 ENCOUNTER — Encounter: Payer: Self-pay | Admitting: Family

## 2015-12-11 VITALS — BP 107/66 | HR 88 | Temp 98.5°F | Resp 16 | Ht 64.75 in | Wt 120.0 lb

## 2015-12-11 DIAGNOSIS — L03116 Cellulitis of left lower limb: Secondary | ICD-10-CM

## 2015-12-11 MED ORDER — DOXYCYCLINE HYCLATE 100 MG PO TABS: 100.0000 mg | ORAL_TABLET | Freq: Two times a day (BID) | ORAL | Status: DC

## 2015-12-11 NOTE — Patient Instructions (Signed)
Please start doxycycline. Call if increased pain, redness/swelling or if you develop fever. Follow up on Monday.

## 2015-12-11 NOTE — Progress Notes (Signed)
Pre visit review using our clinic review tool, if applicable. No additional management support is needed unless otherwise documented below in the visit note. 

## 2015-12-11 NOTE — Progress Notes (Signed)
   Subjective:    Patient ID: Suzanne Mays, female    DOB: [birth year], 25 y.o.   MRN: [id number]  HPI  Suzanne Mays is a 25 yr old female who presents today with report of "insect bite" to the left calf. Reports that her leg is painful.  Reports that the "bite" just started draining. Hurts to walk on her left leg.   Review of Systems See HPI  No past medical history on file.  Social History   Social History  . Marital Status: Single    Spouse Name: N/A  . Number of Children: N/A  . Years of Education: N/A   Occupational History  . Not on file.   Social History Main Topics  . Smoking status: Never Smoker   . Smokeless tobacco: Never Used  . Alcohol Use: No  . Drug Use: No  . Sexual Activity: Yes    Birth Control/ Protection: Pill     Comment: JUNEL    Other Topics Concern  . Not on file   Social History Narrative   Journalism/media studies- [employer]   Lives with mom and 2 dogs   Enjoys sleeping in her spare time.    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 Wisdom tooth extraction      Family History  Problem Relation Age of Onset  . Hypertension Mother   . Diabetes Mother   . Stroke Maternal Grandmother   . Cancer Maternal Grandmother     breast & brain    No Known Allergies  Current Outpatient Prescriptions on File Prior to Visit  Medication Sig Dispense Refill  . fluticasone (FLONASE) 50 MCG/ACT nasal spray 1 spray each nares bid 10 g 1  . Multiple Vitamins-Minerals (CENTRUM PO) Take 1 tablet by mouth daily.    . norethindrone-ethinyl estradiol-iron (MICROGESTIN FE,GILDESS FE,LOESTRIN FE) 1.5-30 MG-MCG tablet Take 1 tablet by mouth daily. 1 Package 11   No current facility-administered medications on file prior to visit.    BP 107/66 mmHg  Pulse 88  Temp(Src) 98.5 F (36.9 C) (Oral)  Resp 16  Ht 5' 4.75" (1.645 m)  Wt 120 lb (54.432  kg)  BMI 20.12 kg/m2  SpO2 100%       Objective:   Physical Exam  Constitutional: She is oriented to person, place, and time. She appears well-developed and well-nourished.  HENT:  Head: Normocephalic and atraumatic.  Neurological: She is alert and oriented to person, place, and time.  Skin:  2 small "bite" lesions left lateral calf.  Lower lesion is draining some serous fluid.  + surrounding erythema is noted.   Psychiatric: She has a normal mood and affect. Her behavior is normal. Judgment and thought content normal.          Assessment & Plan:  Cellulitis LLE- advised pt to monitor area. Begin doxycycline. Call if increased redness/pain/swelling, or if symptoms do not improve. Pt verbalizes understanding.

## 2015-12-16 ENCOUNTER — Ambulatory Visit (INDEPENDENT_AMBULATORY_CARE_PROVIDER_SITE_OTHER): Payer: No Typology Code available for payment source | Admitting: Family

## 2015-12-16 ENCOUNTER — Encounter: Payer: Self-pay | Admitting: Family

## 2015-12-16 ENCOUNTER — Ambulatory Visit: Payer: No Typology Code available for payment source | Admitting: Family

## 2015-12-16 VITALS — BP 100/70 | HR 67 | Temp 97.8°F | Resp 16 | Ht 64.75 in | Wt 121.4 lb

## 2015-12-16 DIAGNOSIS — L03116 Cellulitis of left lower limb: Secondary | ICD-10-CM

## 2015-12-16 NOTE — Patient Instructions (Signed)
Please complete antibiotics. Call if recurrent pain/redness/swelling.

## 2015-12-16 NOTE — Progress Notes (Signed)
Pre visit review using our clinic review tool, if applicable. No additional management support is needed unless otherwise documented below in the visit note. 

## 2015-12-16 NOTE — Progress Notes (Signed)
   Subjective:    Patient ID: Suzanne Mays, female    DOB: 05/20/1991, 25 y.o.   MRN: [id number]  HPI  Suzanne Mays is a 25 yr old female who presents today for follow up of her LLE cellulitis. She was seen in the office on 2/22 and placed on doxycline. She reports resolution of pain, redness and swelling.   Review of Systems See HPI  No past medical history on file.  Social History   Social History  . Marital Status: Single    Spouse Name: N/A  . Number of Children: N/A  . Years of Education: N/A   Occupational History  . Not on file.   Social History Main Topics  . Smoking status: Never Smoker   . Smokeless tobacco: Never Used  . Alcohol Use: No  . Drug Use: No  . Sexual Activity: Yes    Birth Control/ Protection: Pill     Comment: JUNEL    Other Topics Concern  . Not on file   Social History Narrative   Journalism/media studies- [employer]   Lives with mom and 2 dogs   Enjoys sleeping in her spare time.    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 Wisdom tooth extraction      Family History  Problem Relation Age of Onset  . Hypertension Mother   . Diabetes Mother   . Stroke Maternal Grandmother   . Cancer Maternal Grandmother     breast & brain    No Known Allergies  Current Outpatient Prescriptions on File Prior to Visit  Medication Sig Dispense Refill  . doxycycline (VIBRA-TABS) 100 MG tablet Take 1 tablet (100 mg total) by mouth 2 (two) times daily. 20 tablet 0  . fluticasone (FLONASE) 50 MCG/ACT nasal spray 1 spray each nares bid 10 g 1  . Multiple Vitamins-Minerals (CENTRUM PO) Take 1 tablet by mouth daily.    . norethindrone-ethinyl estradiol-iron (MICROGESTIN FE,GILDESS FE,LOESTRIN FE) 1.5-30 MG-MCG tablet Take 1 tablet by mouth daily. 1 Package 11   No current facility-administered medications on file prior to visit.    BP 100/70  mmHg  Pulse 67  Temp(Src) 97.8 F (36.6 C) (Oral)  Resp 16  Ht 5' 4.75" (1.645 m)  Wt 121 lb 6.4 oz (55.067 kg)  BMI 20.35 kg/m2  SpO2 98%       Objective:   Physical Exam  Constitutional: She appears well-developed and well-nourished.  Musculoskeletal: She exhibits no edema.  Skin: Skin is warm and dry.  Two small scabs remain left lateral calf, no erythema, no swelling  Psychiatric: She has a normal mood and affect. Her behavior is normal. Judgment and thought content normal.          Assessment & Plan:  Cellulitis- resolved. Advised pt to complete abx, call if symptoms worsen or if they do not continue to improve.

## 2016-10-19 LAB — HM PAP SMEAR: HM Pap smear: NORMAL

## 2017-05-14 ENCOUNTER — Encounter: Payer: Self-pay | Admitting: Family

## 2017-05-14 ENCOUNTER — Ambulatory Visit (INDEPENDENT_AMBULATORY_CARE_PROVIDER_SITE_OTHER): Payer: No Typology Code available for payment source | Admitting: Family

## 2017-05-14 VITALS — BP 108/62 | HR 78 | Temp 98.1°F | Ht 64.0 in | Wt 121.4 lb

## 2017-05-14 DIAGNOSIS — R630 Anorexia: Secondary | ICD-10-CM

## 2017-05-14 DIAGNOSIS — F329 Major depressive disorder, single episode, unspecified: Secondary | ICD-10-CM | POA: Diagnosis not present

## 2017-05-14 DIAGNOSIS — F32A Depression, unspecified: Secondary | ICD-10-CM

## 2017-05-14 LAB — CBC WITH DIFFERENTIAL/PLATELET
BASOS PCT: 0.4 % (ref 0.0–3.0)
Basophils Absolute: 0 10*3/uL (ref 0.0–0.1)
EOS ABS: 0.2 10*3/uL (ref 0.0–0.7)
Eosinophils Relative: 3.9 % (ref 0.0–5.0)
HEMATOCRIT: 40.6 % (ref 36.0–46.0)
Hemoglobin: 13.3 g/dL (ref 12.0–15.0)
LYMPHS PCT: 39.8 % (ref 12.0–46.0)
Lymphs Abs: 1.8 10*3/uL (ref 0.7–4.0)
MCHC: 32.8 g/dL (ref 30.0–36.0)
MCV: 95.1 fl (ref 78.0–100.0)
MONO ABS: 0.3 10*3/uL (ref 0.1–1.0)
Monocytes Relative: 6.8 % (ref 3.0–12.0)
Neutro Abs: 2.2 10*3/uL (ref 1.4–7.7)
Neutrophils Relative %: 49.1 % (ref 43.0–77.0)
Platelets: 317 10*3/uL (ref 150.0–400.0)
RBC: 4.27 Mil/uL (ref 3.87–5.11)
RDW: 12.9 % (ref 11.5–15.5)
WBC: 4.5 10*3/uL (ref 4.0–10.5)

## 2017-05-14 LAB — HEPATIC FUNCTION PANEL
ALT: 8 U/L (ref 0–35)
AST: 13 U/L (ref 0–37)
Albumin: 4.2 g/dL (ref 3.5–5.2)
Alkaline Phosphatase: 20 U/L — ABNORMAL LOW (ref 39–117)
BILIRUBIN TOTAL: 0.4 mg/dL (ref 0.2–1.2)
Bilirubin, Direct: 0.1 mg/dL (ref 0.0–0.3)
Total Protein: 7.7 g/dL (ref 6.0–8.3)

## 2017-05-14 LAB — BASIC METABOLIC PANEL
BUN: 14 mg/dL (ref 6–23)
CHLORIDE: 106 meq/L (ref 96–112)
CO2: 26 mEq/L (ref 19–32)
CREATININE: 0.83 mg/dL (ref 0.40–1.20)
Calcium: 9.5 mg/dL (ref 8.4–10.5)
GFR: 107.46 mL/min (ref >60.00)
Glucose, Bld: 94 mg/dL (ref 70–99)
Potassium: 3.8 mEq/L (ref 3.5–5.1)
Sodium: 140 mEq/L (ref 135–145)

## 2017-05-14 LAB — TSH: TSH: 1.89 u[IU]/mL (ref 0.35–4.50)

## 2017-05-14 MED ORDER — SERTRALINE HCL 50 MG PO TABS: ORAL_TABLET | ORAL | 0 refills | Status: DC

## 2017-05-14 NOTE — Progress Notes (Signed)
Pre visit review using our clinic review tool, if applicable. No additional management support is needed unless otherwise documented below in the visit note. 

## 2017-05-14 NOTE — Patient Instructions (Signed)
Please begin zoloft 1/2 tab once daily for 1 week, then increase to a full tablet once daily on week two. Please schedule an appointment with a counselor.   Complete lab work prior to leaving.

## 2017-05-14 NOTE — Progress Notes (Signed)
Subjective:    Patient ID: Suzanne Mays, female    DOB: 11/11/1990, 26 y.o.   MRN: [id number]  HPI  Ms. Suzanne Mays is a 26 yr old female who presents today with chief complaint of anorexia. X 1 month. Denies associated fevers, nausea. Denies constipation/diarrhea. She has a new boss at work. She works at [employer].  Upon further questioning, she reports that she broke up with her boyfriend 2 months ago. Reports that her life was moving forward career wise but his was not.  He viewed it as a competition and was constantly arguing with her. Reports that she is not sleeping well.  Doesn't look forward to doing things on the weekends but "I force myself to."    Wt Readings from Last 3 Encounters:  05/14/17 121 lb 6 oz (55.1 kg)  12/16/15 121 lb 6.4 oz (55.1 kg)  12/11/15 120 lb (54.4 kg)   Lab Results  Component Value Date   TSH 2.690 08/05/2014      Review of Systems See HPI  No past medical history on file.   Social History   Social History  . Marital status: Single    Spouse name: N/A  . Number of children: N/A  . Years of education: N/A   Occupational History  . Not on file.   Social History Main Topics  . Smoking status: Never Smoker  . Smokeless tobacco: Never Used  . Alcohol use No  . Drug use: No  . Sexual activity: Yes    Birth control/ protection: Pill     Comment: JUNEL    Other Topics Concern  . 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 WISDOM TOOTH EXTRACTION      Family History  Problem Relation Age of Onset  . Hypertension Mother   . Diabetes Mother   . Stroke Maternal Grandmother   . Cancer Maternal Grandmother        breast & brain    No Known Allergies  Current Outpatient  Prescriptions on File Prior to Visit  Medication Sig Dispense Refill  . Multiple Vitamins-Minerals (CENTRUM PO) Take 1 tablet by mouth daily.    . norethindrone-ethinyl estradiol-iron (MICROGESTIN FE,GILDESS FE,LOESTRIN FE) 1.5-30 MG-MCG tablet Take 1 tablet by mouth daily. 1 Package 11   No current facility-administered medications on file prior to visit.     BP 108/62 (BP Location: Left Arm, Patient Position: Sitting, Cuff Size: Normal)   Pulse 78   Temp 98.1 F (36.7 C) (Oral)   Ht 5\' 4"  (1.626 m)   Wt 121 lb 6 oz (55.1 kg)   SpO2 98%   BMI 20.83 kg/m       Objective:   Physical Exam  Constitutional: She is oriented to person, place, and time. She appears well-developed and well-nourished. No distress.  Neurological: She is alert and oriented to person, place, and time.  Psychiatric: Her behavior is normal. Judgment and thought content normal.  tearful          Assessment & Plan:  Anorexia- check cbc, tsh, cmet.  Depression- scored 21 on PHQ-9.  I think that most likely depression is cause of her anorexia.  Will initiate Zolfot 50mg .   I instructed pt to start 1/2 tablet once daily for 1 week and then increase to a full tablet once daily on week two as tolerated.  We discussed common side effects such as nausea, drowsiness and weight gain.  Also discussed rare but serious side effect of suicide ideation.  She is instructed to discontinue medication go directly to ED if this occurs.  Pt verbalizes understanding. Also advised pt to schedule an appointment with a counselor. Plan follow up in 1 month to evaluate progress.   A total of 30  minutes were spent face-to-face with the patient during this encounter and over half of that time was spent on counseling and coordination of care. The patient was counseled on depression and treatment.

## 2017-06-28 ENCOUNTER — Encounter: Payer: Self-pay | Admitting: Family

## 2017-06-28 ENCOUNTER — Ambulatory Visit (INDEPENDENT_AMBULATORY_CARE_PROVIDER_SITE_OTHER): Payer: No Typology Code available for payment source | Admitting: Family

## 2017-06-28 VITALS — BP 92/54 | HR 64 | Temp 98.6°F | Resp 16 | Ht 64.0 in | Wt 119.2 lb

## 2017-06-28 DIAGNOSIS — F32A Depression, unspecified: Secondary | ICD-10-CM

## 2017-06-28 DIAGNOSIS — F329 Major depressive disorder, single episode, unspecified: Secondary | ICD-10-CM

## 2017-06-28 MED ORDER — SERTRALINE HCL 50 MG PO TABS: 50.0000 mg | ORAL_TABLET | Freq: Every day | ORAL | 1 refills | Status: DC

## 2017-06-28 NOTE — Patient Instructions (Addendum)
Please increase your zoloft to 50mg  once daily.  Contact [facility] to schedule an appointment- [phone]

## 2017-06-28 NOTE — Progress Notes (Signed)
Subjective:    Patient ID: Suzanne Mays, female    DOB: August 15, 1991, 26 y.o.   MRN: [id number]  HPI  [name] is a 26 yr old female who presents today with chief complaint of depression.  Last visit she noted depressed moods and difficulty motivating. Had recently broken up with her boyfriend. She scored 21 on PHQ-9 last visit. We started her on zoloft.  Denies side effects from zoloft.  Only eating once a day- eats a big lunch.  Still finding it hard to motivate on the weekends.  Has trouble sleeping.  She is only taking 1/2 tablet of the zoloft.  Feels like she is doing well emotionally in regards to dealing with her recent breakup.   Wt Readings from Last 3 Encounters:  06/28/17 119 lb 3.2 oz (54.1 kg)  05/14/17 121 lb 6 oz (55.1 kg)  12/16/15 121 lb 6.4 oz (55.1 kg)      Review of Systems    see HPI  No past medical history on file.   Social History   Social History  . Marital status: Single    Spouse name: N/A  . Number of children: N/A  . Years of education: N/A   Occupational History  . Not on file.   Social History Main Topics  . Smoking status: Never Smoker  . Smokeless tobacco: Never Used  . Alcohol use No  . Drug use: No  . Sexual activity: Yes    Birth control/ protection: Pill     Comment: JUNEL    Other Topics Concern  . Not on file   Social History Narrative   Journalism/media studies- [employer]   Lives with mom and 2 dogs   Enjoys sleeping in her spare time.    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 WISDOM TOOTH EXTRACTION      Family History  Problem Relation Age of Onset  . Hypertension Mother   . Diabetes Mother   . Stroke Maternal Grandmother   . Cancer Maternal Grandmother        breast & brain    No Known Allergies  Current Outpatient Prescriptions on File Prior to Visit  Medication Sig Dispense Refill  .  Multiple Vitamins-Minerals (CENTRUM PO) Take 1 tablet by mouth daily.    . norethindrone-ethinyl estradiol-iron (MICROGESTIN FE,GILDESS FE,LOESTRIN FE) 1.5-30 MG-MCG tablet Take 1 tablet by mouth daily. 1 Package 11   No current facility-administered medications on file prior to visit.     BP (!) 92/54   Pulse 64   Temp 98.6 F (37 C) (Oral)   Resp 16   Ht  (1.626 m)   Wt 119 lb 3.2 oz (54.1 kg)   SpO2 100%   BMI 20.46 kg/m    Objective:   Physical Exam  Constitutional: She is oriented to person, place, and time. She appears well-developed and well-nourished.  Cardiovascular: Normal rate, regular rhythm and normal heart sounds.   No murmur heard. Pulmonary/Chest: Effort normal and breath sounds normal. No respiratory distress. She has no wheezes.  Neurological: She is alert and oriented to person, place, and time.  Psychiatric: Her behavior is normal. Judgment and thought content normal.  Flat affect           Assessment & Plan:  Depression- only taking  of zoloft.  Scored 17 on PHQ9 today.  Showing some improvement. Advised pt to increase zoloft from  to .  I am hopeful she will see continued improvement with the higher dose. Also encouraged her to establish with a counselor.

## 2017-07-26 ENCOUNTER — Ambulatory Visit (INDEPENDENT_AMBULATORY_CARE_PROVIDER_SITE_OTHER): Payer: No Typology Code available for payment source | Admitting: Family

## 2017-07-26 ENCOUNTER — Encounter: Payer: Self-pay | Admitting: Family

## 2017-07-26 VITALS — BP 100/60 | HR 80 | Temp 98.3°F | Resp 16 | Ht 64.0 in | Wt 118.2 lb

## 2017-07-26 DIAGNOSIS — F32A Depression, unspecified: Secondary | ICD-10-CM

## 2017-07-26 DIAGNOSIS — F329 Major depressive disorder, single episode, unspecified: Secondary | ICD-10-CM

## 2017-07-26 NOTE — Patient Instructions (Signed)
Continue zoloft.   Please call [facility] to schedule an appointment with one of our counselors.

## 2017-07-26 NOTE — Progress Notes (Signed)
   Subjective:    Patient ID: Suzanne Mays, female    DOB: [birth year], 26 y.o.   MRN: [id number]  HPI  Suzanne Mays is a 25 yr old female who presents today for follow up of her depression. Last visit we increased her zoloft from  to .  She reports that she notes a noticeable improvement with this dosing.  Finding it easier to motivate.     Review of Systems    see HPI  No past medical history on file.   Social History   Social History  . Marital status: Single    Spouse name: N/A  . Number of children: N/A  . Years of education: N/A   Occupational History  . Not on file.   Social History Main Topics  . Smoking status: Never Smoker  . Smokeless tobacco: Never Used  . Alcohol use No  . Drug use: No  . Sexual activity: Yes    Birth control/ protection: Pill     Comment: JUNEL    Other Topics Concern  . Not on file   Social History Narrative   Journalism/media studies- [employer]   Lives with mom and 2 dogs   Enjoys sleeping in her spare time.    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 WISDOM TOOTH EXTRACTION      Family History  Problem Relation Age of Onset  . Hypertension Mother   . Diabetes Mother   . Stroke Maternal Grandmother   . Cancer Maternal Grandmother        breast & brain    No Known Allergies  Current Outpatient Prescriptions on File Prior to Visit  Medication Sig Dispense Refill  . Multiple Vitamins-Minerals (CENTRUM PO) Take 1 tablet by mouth daily.    . norethindrone-ethinyl estradiol-iron (MICROGESTIN FE,GILDESS FE,LOESTRIN FE) 1.5-30 MG-MCG tablet Take 1 tablet by mouth daily. 1 Package 11  . sertraline (ZOLOFT) 50 MG tablet Take 1 tablet (50 mg total) by mouth daily. 90 tablet 1   No current facility-administered medications on file prior to visit.     BP 100/60 (BP Location: Left Arm, Cuff Size: Normal)    Pulse 80   Temp 98.3 F (36.8 C) (Oral)   Resp 16   Ht  (1.626 m)   Wt 118 lb 3.2 oz (53.6 kg)   SpO2 99%   BMI 20.29 kg/m    Objective:   Physical Exam  Constitutional: She appears well-developed and well-nourished. No distress.  Psychiatric: She has a normal mood and affect. Her behavior is normal. Judgment and thought content normal.  Appears much happier today, smiling          Assessment & Plan:  Depression scored 14 on PHQ-9.  Clinically seems much improved. I have encouraged her to establish with a therapist.  Continue current dose of zoloft. Follow up in 6 weeks. If no improvement with counseling consider adding an additional medication for depression.

## 2017-10-19 ENCOUNTER — Emergency Department (HOSPITAL_BASED_OUTPATIENT_CLINIC_OR_DEPARTMENT_OTHER): Payer: BC Managed Care – PPO

## 2017-10-19 ENCOUNTER — Emergency Department (HOSPITAL_BASED_OUTPATIENT_CLINIC_OR_DEPARTMENT_OTHER)
Admission: EM | Admit: 2017-10-19 | Discharge: 2017-10-19 | Disposition: A | Discharge status: Home / Self Care | Payer: BC Managed Care – PPO | Attending: Emergency Medicine | Admitting: Emergency Medicine

## 2017-10-19 ENCOUNTER — Encounter (HOSPITAL_BASED_OUTPATIENT_CLINIC_OR_DEPARTMENT_OTHER): Payer: Self-pay | Admitting: *Deleted

## 2017-10-19 ENCOUNTER — Other Ambulatory Visit: Payer: Self-pay

## 2017-10-19 DIAGNOSIS — Z79899 Other long term (current) drug therapy: Secondary | ICD-10-CM | POA: Insufficient documentation

## 2017-10-19 DIAGNOSIS — J069 Acute upper respiratory infection, unspecified: Secondary | ICD-10-CM | POA: Diagnosis not present

## 2017-10-19 DIAGNOSIS — B9789 Other viral agents as the cause of diseases classified elsewhere: Secondary | ICD-10-CM | POA: Diagnosis not present

## 2017-10-19 DIAGNOSIS — R05 Cough: Secondary | ICD-10-CM | POA: Diagnosis present

## 2017-10-19 NOTE — ED Provider Notes (Signed)
[facility] EMERGENCY DEPARTMENT Provider Note   CSN: [id number] Arrival date & time: 10/19/17  1409     History   Chief Complaint Chief Complaint  Patient presents with  . Cough    HPI Suzanne Mays is a 27 y.o. female.  HPI  27 year old female presents with cough and headache for 3 days.  She has been having a frontal headache, cough, nasal congestion and rhinorrhea during this time.  No fevers but has had some night sweats.  The cough is occasionally productive of yellow sputum.  She has yellow nasal discharge as well.  No facial pain.  She has not had any vomiting.  She has had decreased appetite but has been drinking fluids.  She feels short of breath when breathing through her nose but otherwise has been breathing well.  No chest pain.  History reviewed. No pertinent past medical history.  Patient Active Problem List   Diagnosis Date Noted  . Palpitations 08/07/2014  . Costochondritis 08/07/2014  . Sleep deficient 08/07/2014  . Travel foreign 05/30/2013  . Musculoskeletal pain 01/16/2013  . Depression 11/14/2012  . Abnormal Pap smear of vagina 10/05/2012  . Dysmenorrhea 10/03/2012  . Routine general medical examination at a health care facility 09/07/2012    Past Surgical History:  Procedure Laterality Date  . WISDOM TOOTH EXTRACTION      OB History    Gravida Para Term Preterm AB Living   0             SAB TAB Ectopic Multiple Live Births                   Home Medications    Prior to Admission medications   Medication Sig Start Date End Date Taking? Authorizing Provider  norethindrone-ethinyl estradiol-iron (MICROGESTIN FE,GILDESS FE,LOESTRIN FE) 1.5-30 MG-MCG tablet Take 1 tablet by mouth daily. 11/17/13  Yes Sandford Craze'Sullivan, Melissa, NP  Multiple Vitamins-Minerals (CENTRUM PO) Take 1 tablet by mouth daily.    [provider]  sertraline (ZOLOFT) 50 MG tablet Take 1 tablet (50 mg total) by mouth daily. 06/28/17   Sandford Craze'Sullivan, Melissa, NP     Family History Family History  Problem Relation Age of Onset  . Hypertension Mother   . Diabetes Mother   . Stroke Maternal Grandmother   . Cancer Maternal Grandmother        breast & brain    Social History Social History   Tobacco Use  . Smoking status: Never Smoker  . Smokeless tobacco: Never Used  Substance Use Topics  . Alcohol use: No  . Drug use: No     Allergies   Patient has no known allergies.   Review of Systems Review of Systems  Constitutional: Negative for fever.  HENT: Positive for congestion, ear pain (feel full) and rhinorrhea. Negative for sore throat.   Respiratory: Positive for cough. Negative for shortness of breath.   Cardiovascular: Positive for chest pain (when coughing).  Gastrointestinal: Negative for vomiting.  Neurological: Positive for headaches.  All other systems reviewed and are negative.    Physical Exam Updated Vital Signs BP 91/66 (BP Location: Right Arm)   Pulse (!) 102   Temp 98.2 F (36.8 C) (Oral)   Resp 18   Ht 5\' 4"  (1.626 m)   Wt 53.5 kg (118 lb)   SpO2 99%   BMI 20.25 kg/m   Physical Exam  Constitutional: She is oriented to person, place, and time. She appears well-developed and well-nourished. No distress.  HENT:  Head: Normocephalic and atraumatic.  Right Ear: Tympanic membrane, external ear and ear canal normal.  Left Ear: Tympanic membrane, external ear and ear canal normal.  Nose: Nose normal. Right sinus exhibits no maxillary sinus tenderness and no frontal sinus tenderness. Left sinus exhibits no maxillary sinus tenderness and no frontal sinus tenderness.  Mouth/Throat: Oropharynx is clear and moist. No oropharyngeal exudate.  Eyes: Right eye exhibits no discharge. Left eye exhibits no discharge.  Cardiovascular: Normal rate, regular rhythm and normal heart sounds.  Pulmonary/Chest: Effort normal and breath sounds normal. She has no wheezes. She has no rales.  Abdominal: She exhibits no distension.   Neurological: She is alert and oriented to person, place, and time.  Skin: Skin is warm and dry. She is not diaphoretic.  Nursing note and vitals reviewed.    ED Treatments / Results  Labs (all labs ordered are listed, but only abnormal results are displayed) Labs Reviewed - No data to display  EKG  EKG Interpretation None       Radiology Dg Chest 2 View  Result Date: 10/19/2017 CLINICAL DATA:  Cough and congestion for 1 week. EXAM: CHEST  2 VIEW COMPARISON:  08/05/2014 FINDINGS: The cardiac silhouette, mediastinal and hilar contours are normal. The lungs are clear. No pleural effusion. The bony thorax is intact. IMPRESSION: No acute cardiopulmonary findings. Electronically Signed   By: Rudie Meyer M.D.   On: 10/19/2017 15:19    Procedures Procedures (including critical care time)  Medications Ordered in ED Medications - No data to display   Initial Impression / Assessment and Plan / ED Course  I have reviewed the triage vital signs and the nursing notes.  Pertinent labs & imaging results that were available during my care of the patient were reviewed by me and considered in my medical decision making (see chart for details).     Patient appears to have a viral URI.  She does not appears significantly ill or dehydrated.  She is breathing well with no hypoxia or increased work of breathing.  X-ray benign.  This is likely viral, discussed symptomatic care and over-the-counter treatments.  Return precautions.  Final Clinical Impressions(s) / ED Diagnoses   Final diagnoses:  Viral upper respiratory tract infection with cough    ED Discharge Orders    None       Pricilla Loveless, MD 10/19/17 [phone]

## 2017-10-19 NOTE — ED Triage Notes (Addendum)
Flu exposure. She has a cough. Facial pain.

## 2018-01-17 ENCOUNTER — Ambulatory Visit (INDEPENDENT_AMBULATORY_CARE_PROVIDER_SITE_OTHER): Payer: BC Managed Care – PPO | Admitting: Family

## 2018-01-17 ENCOUNTER — Encounter: Payer: Self-pay | Admitting: Family

## 2018-01-17 VITALS — BP 97/57 | HR 73 | Temp 98.6°F | Resp 16 | Ht 64.0 in | Wt 130.8 lb

## 2018-01-17 DIAGNOSIS — F329 Major depressive disorder, single episode, unspecified: Secondary | ICD-10-CM | POA: Diagnosis not present

## 2018-01-17 DIAGNOSIS — F32A Depression, unspecified: Secondary | ICD-10-CM

## 2018-01-17 NOTE — Progress Notes (Signed)
Subjective:    Patient ID: Suzanne Mays, female    DOB: 07/25/1991, 27 y.o.   MRN: [id number]  HPI  Patient is a 27 yr old female who presents today for follow up of her depression.  Reports that she changed departments at her job.  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  Feels like her depression is overall improved.   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 She is off of zoloft since November. She spoke with her pastor but did not establish with a formal counselor.  She has gained some weight back which she is happy about.   Review of Systems See HPI  No past medical history on file.   Social History   Socioeconomic History  . Marital status: Single    Spouse name: Not on file  . Number of children: Not on file  . Years of education: Not on file  . Highest education level: Not on file  Occupational History  . Not on file  Social Needs  . Financial resource strain: Not on file  . Food insecurity:    Worry: Not on file    Inability: Not on file  . Transportation needs:    Medical: Not on file    Non-medical: Not on file  Tobacco Use  . Smoking status: Never Smoker  . Smokeless tobacco: Never Used  Substance and Sexual Activity  . Alcohol use: No  . Drug use: No  . Sexual activity: Yes    Birth control/protection: Pill    Comment: JUNEL   Lifestyle  . Physical activity:    Days per week: Not on file    Minutes per session: Not on file  . Stress: Not on file  Relationships  . Social connections:    Talks on phone: Not on file    Gets together: Not on file    Attends religious service: Not on file    Active member of club or organization: Not on file    Attends meetings of clubs or organizations: Not on file    Relationship status: Not on file  . Intimate partner violence:    Fear of current or ex partner: Not on file    Emotionally abused: Not on file    Physically abused: Not on file    Forced sexual activity:  Not on file  Other Topics Concern  . Not on file  Social History Narrative   Journalism/media studies- [employer]   Lives with mom and 2 dogs   Enjoys sleeping in her spare time.    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 WISDOM TOOTH EXTRACTION      Family History  Problem Relation Age of Onset  . Hypertension Mother   . Diabetes Mother   . Stroke Maternal Grandmother   . Cancer Maternal Grandmother        breast & brain    Allergies  Allergen Reactions  . Zoloft [Sertraline Hcl]     Somnolence    Current Outpatient Medications on File Prior to Visit  Medication Sig Dispense Refill  . Multiple Vitamins-Minerals (CENTRUM PO) Take 1 tablet by mouth daily.    . norethindrone-ethinyl estradiol-iron (MICROGESTIN FE,GILDESS FE,LOESTRIN FE) 1.5-30 MG-MCG tablet Take 1 tablet by mouth daily. 1 Package 11   No current facility-administered  medications on file prior to visit.     BP (!) 97/57 (BP Location: Left Arm, Cuff Size: Normal)   Pulse 73   Temp 98.6 F (37 C) (Oral)   Resp 16   Ht 5\' 4"  (1.626 m)   SpO2 99%   BMI 20.25 kg/m       Objective:   Physical Exam  Constitutional: She is oriented to person, place, and time. She appears well-developed and well-nourished. No distress.  Neurological: She is alert and oriented to person, place, and time.  Psychiatric: She has a normal mood and affect. Her behavior is normal. Judgment and thought content normal.          Assessment & Plan:  Depression- stable, improved.  Off of medication. Scored 5 on PHQ-9. She is encouraged by her interviews and hopes for a permanent position at [employer].    A total of 20 minutes were spent face-to-face with the patient during this encounter and over half of that time was spent on counseling and coordination of care. The patient was counseled on depression.

## 2018-09-19 ENCOUNTER — Encounter: Payer: Self-pay | Admitting: Family

## 2018-09-19 ENCOUNTER — Ambulatory Visit (INDEPENDENT_AMBULATORY_CARE_PROVIDER_SITE_OTHER): Payer: Self-pay | Admitting: Family

## 2018-09-19 VITALS — BP 97/59 | HR 61 | Temp 98.5°F | Resp 16 | Ht 64.0 in | Wt 136.0 lb

## 2018-09-19 DIAGNOSIS — R61 Generalized hyperhidrosis: Secondary | ICD-10-CM

## 2018-09-19 DIAGNOSIS — Z23 Encounter for immunization: Secondary | ICD-10-CM

## 2018-09-19 DIAGNOSIS — Z0000 Encounter for general adult medical examination without abnormal findings: Secondary | ICD-10-CM

## 2018-09-19 NOTE — Progress Notes (Signed)
Subjective:    Patient ID: Suzanne Mays, female    DOB: [birth year], 27 y.o.   MRN: [id number]  HPI   Patient presents today for complete physical.  Immunizations: due for tdap and flu Diet: reports that it is hard to eat healthy when she travels Wt Readings from Last 3 Encounters:  09/19/18 136 lb (61.7 kg)  01/17/18 130 lb 12.8 oz (59.3 kg)  10/19/17 118 lb (53.5 kg)  Exercise: walks on treadmill 30 minutes a day Pap Smear:  1/18 Dental:  Up to date Vision:  scheduled  Reports that she had night sweats in September and October. This resolved 3-4 weeks ago.        Review of Systems  Constitutional: Negative for unexpected weight change.  HENT: Negative for hearing loss and rhinorrhea.   Eyes: Negative for visual disturbance.  Respiratory: Negative for cough.   Cardiovascular: Negative for leg swelling.  Gastrointestinal: Negative for constipation and diarrhea.  Genitourinary: Negative for dysuria and frequency.  Musculoskeletal: Negative for arthralgias and myalgias.  Skin: Negative for rash.  Neurological: Negative for headaches.  Hematological: Negative for adenopathy.  Psychiatric/Behavioral:       Denies depression/anxiety   No past medical history on file.   Social History   Socioeconomic History  . Marital status: Single    Spouse name: Not on file  . Number of children: Not on file  . Years of education: Not on file  . Highest education level: Not on file  Occupational History  . Not on file  Social Needs  . Financial resource strain: Not on file  . Food insecurity:    Worry: Not on file    Inability: Not on file  . Transportation needs:    Medical: Not on file    Non-medical: Not on file  Tobacco Use  . Smoking status: Never Smoker  . Smokeless tobacco: Never Used  Substance and Sexual Activity  . Alcohol use: No  . Drug use: No  . Sexual activity: Yes    Birth control/protection: Pill    Comment: JUNEL   Lifestyle  . Physical activity:      Days per week: Not on file    Minutes per session: Not on file  . Stress: Not on file  Relationships  . Social connections:    Talks on phone: Not on file    Gets together: Not on file    Attends religious service: Not on file    Active member of club or organization: Not on file    Attends meetings of clubs or organizations: Not on file    Relationship status: Not on file  . Intimate partner violence:    Fear of current or ex partner: Not on file    Emotionally abused: Not on file    Physically abused: Not on file    Forced sexual activity: Not on file  Other Topics Concern  . Not on file  Social History Narrative   Journalism/media studies- [employer]   Lives with mom and 2 dogs   Enjoys sleeping in her spare time.    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 WISDOM TOOTH EXTRACTION      Family History  Problem Relation Age of Onset  . Hypertension Mother   . Diabetes Mother   . Stroke Maternal Grandmother   . Cancer  Maternal Grandmother        breast & brain    Allergies  Allergen Reactions  . Zoloft [Sertraline Hcl]     Somnolence    Current Outpatient Medications on File Prior to Visit  Medication Sig Dispense Refill  . Multiple Vitamins-Minerals (CENTRUM PO) Take 1 tablet by mouth daily.    . norethindrone-ethinyl estradiol-iron (MICROGESTIN FE,GILDESS FE,LOESTRIN FE) 1.5-30 MG-MCG tablet Take 1 tablet by mouth daily. 1 Package 11   No current facility-administered medications on file prior to visit.     BP (!) 97/59 (BP Location: Left Arm, Patient Position: Sitting, Cuff Size: Small)   Pulse 61   Temp 98.5 F (36.9 C) (Oral)   Resp 16   Ht 5\' 4"  (1.626 m)   Wt 136 lb (61.7 kg)   SpO2 100%   BMI 23.34 kg/m       Objective:   Physical Exam  Constitutional: She is oriented to person, place, and time. She appears well-developed  and well-nourished.  Neck: Neck supple. No thyromegaly present.  Cardiovascular: Normal rate, regular rhythm and normal heart sounds.  No murmur heard. Pulmonary/Chest: Effort normal and breath sounds normal. No respiratory distress. She has no wheezes.  Neurological: She is alert and oriented to person, place, and time.  Skin: Skin is warm and dry.  Psychiatric: She has a normal mood and affect. Her behavior is normal. Judgment and thought content normal.  Breast: normal breast exam, no palpable masses      Assessment & Plan:  Preventative care- tdap and flu today.  Pap up go date. Discussed healthy diet and exercise.  Obtain routine lab work.

## 2018-09-19 NOTE — Patient Instructions (Signed)
Please call me if you have recurrent night sweats and we will do further testing. Continue healthy diet and regular exercise.

## 2018-09-20 ENCOUNTER — Encounter: Payer: Self-pay | Admitting: Family

## 2018-09-20 LAB — URINALYSIS, ROUTINE W REFLEX MICROSCOPIC
BILIRUBIN URINE: NEGATIVE
Hgb urine dipstick: NEGATIVE
Ketones, ur: NEGATIVE
LEUKOCYTES UA: NEGATIVE
Nitrite: NEGATIVE
RBC / HPF: NONE SEEN (ref 0–?)
Specific Gravity, Urine: 1.02 (ref 1.000–1.030)
Total Protein, Urine: NEGATIVE
Urine Glucose: NEGATIVE
Urobilinogen, UA: 1 (ref 0.0–1.0)
pH: 6.5 (ref 5.0–8.0)

## 2018-09-20 LAB — LIPID PANEL
Cholesterol: 225 mg/dL — ABNORMAL HIGH (ref 0–200)
HDL: 55.2 mg/dL (ref >39.00)
LDL Cholesterol: 153 mg/dL — ABNORMAL HIGH (ref 0–99)
NonHDL: 169.64
Total CHOL/HDL Ratio: 4
Triglycerides: 84 mg/dL (ref 0.0–149.0)
VLDL: 16.8 mg/dL (ref 0.0–40.0)

## 2018-09-20 LAB — CBC WITH DIFFERENTIAL/PLATELET
Basophils Absolute: 0 10*3/uL (ref 0.0–0.1)
Basophils Relative: 0.3 % (ref 0.0–3.0)
EOS ABS: 0.2 10*3/uL (ref 0.0–0.7)
Eosinophils Relative: 3.7 % (ref 0.0–5.0)
HCT: 38.1 % (ref 36.0–46.0)
Hemoglobin: 12.7 g/dL (ref 12.0–15.0)
Lymphocytes Relative: 32.8 % (ref 12.0–46.0)
Lymphs Abs: 1.9 10*3/uL (ref 0.7–4.0)
MCHC: 33.4 g/dL (ref 30.0–36.0)
MCV: 92.8 fl (ref 78.0–100.0)
MONO ABS: 0.6 10*3/uL (ref 0.1–1.0)
Monocytes Relative: 9.5 % (ref 3.0–12.0)
Neutro Abs: 3.2 10*3/uL (ref 1.4–7.7)
Neutrophils Relative %: 53.7 % (ref 43.0–77.0)
Platelets: 300 10*3/uL (ref 150.0–400.0)
RBC: 4.11 Mil/uL (ref 3.87–5.11)
RDW: 12.9 % (ref 11.5–15.5)
WBC: 5.9 10*3/uL (ref 4.0–10.5)

## 2018-09-20 LAB — HEPATIC FUNCTION PANEL
ALT: 6 U/L (ref 0–35)
AST: 10 U/L (ref 0–37)
Albumin: 4 g/dL (ref 3.5–5.2)
Alkaline Phosphatase: 25 U/L — ABNORMAL LOW (ref 39–117)
Bilirubin, Direct: 0 mg/dL (ref 0.0–0.3)
Total Bilirubin: 0.2 mg/dL (ref 0.2–1.2)
Total Protein: 7.1 g/dL (ref 6.0–8.3)

## 2018-09-20 LAB — TSH: TSH: 1.55 u[IU]/mL (ref 0.35–4.50)

## 2018-09-20 LAB — BASIC METABOLIC PANEL
BUN: 17 mg/dL (ref 6–23)
CALCIUM: 8.8 mg/dL (ref 8.4–10.5)
CO2: 28 meq/L (ref 19–32)
CREATININE: 0.69 mg/dL (ref 0.40–1.20)
Chloride: 104 mEq/L (ref 96–112)
GFR: 131.59 mL/min (ref >60.00)
GLUCOSE: 89 mg/dL (ref 70–99)
Potassium: 4 mEq/L (ref 3.5–5.1)
Sodium: 138 mEq/L (ref 135–145)

## 2018-09-21 LAB — HIV ANTIBODY (ROUTINE TESTING W REFLEX): HIV 1&2 Ab, 4th Generation: NONREACTIVE

## 2018-09-22 ENCOUNTER — Encounter: Payer: Self-pay | Admitting: Family

## 2019-05-12 IMAGING — CR DG CHEST 2V
2 series · 2 of 2 positions shown · non-contrast
Comparison: 08/05/2014

CLINICAL DATA: Cough and congestion for 1 week.

EXAM:
CHEST  2 VIEW

[w chest pa]
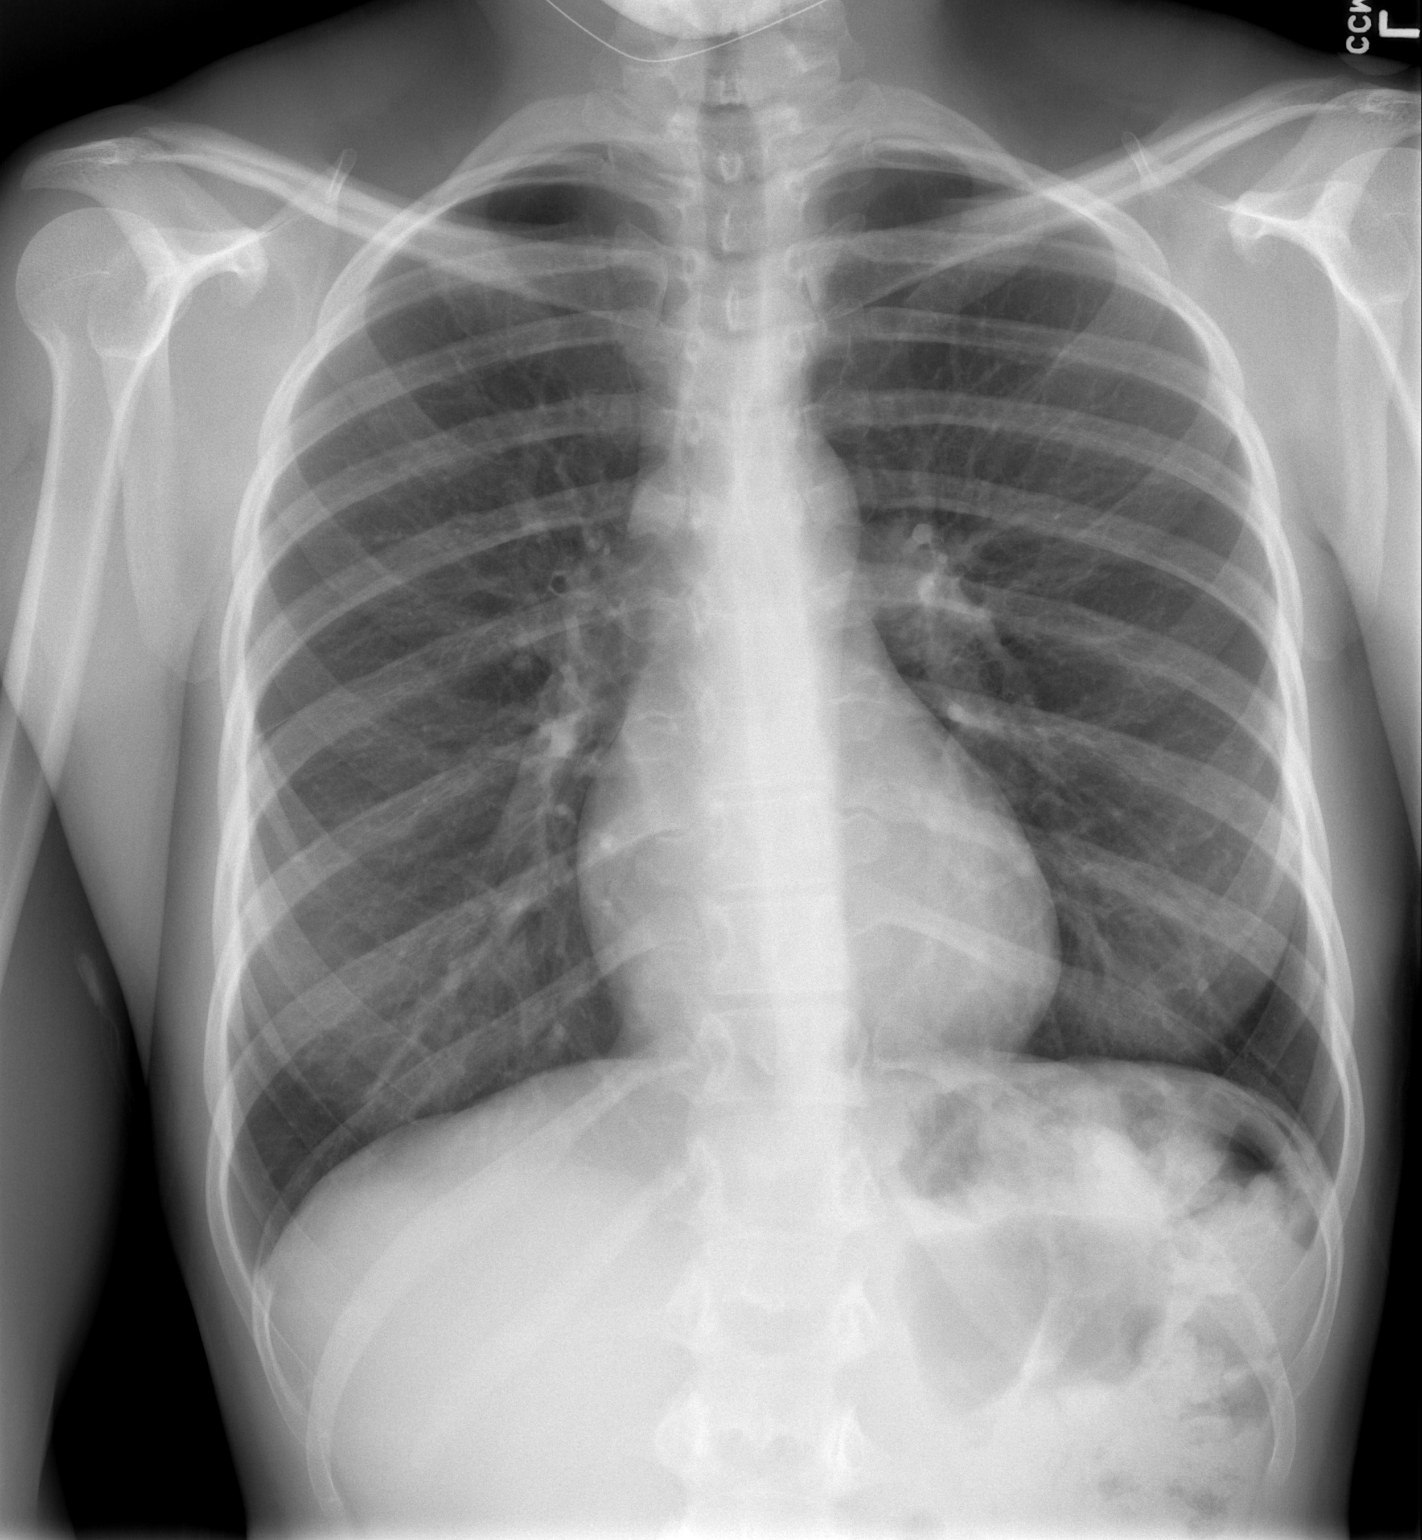

[w chest lat]
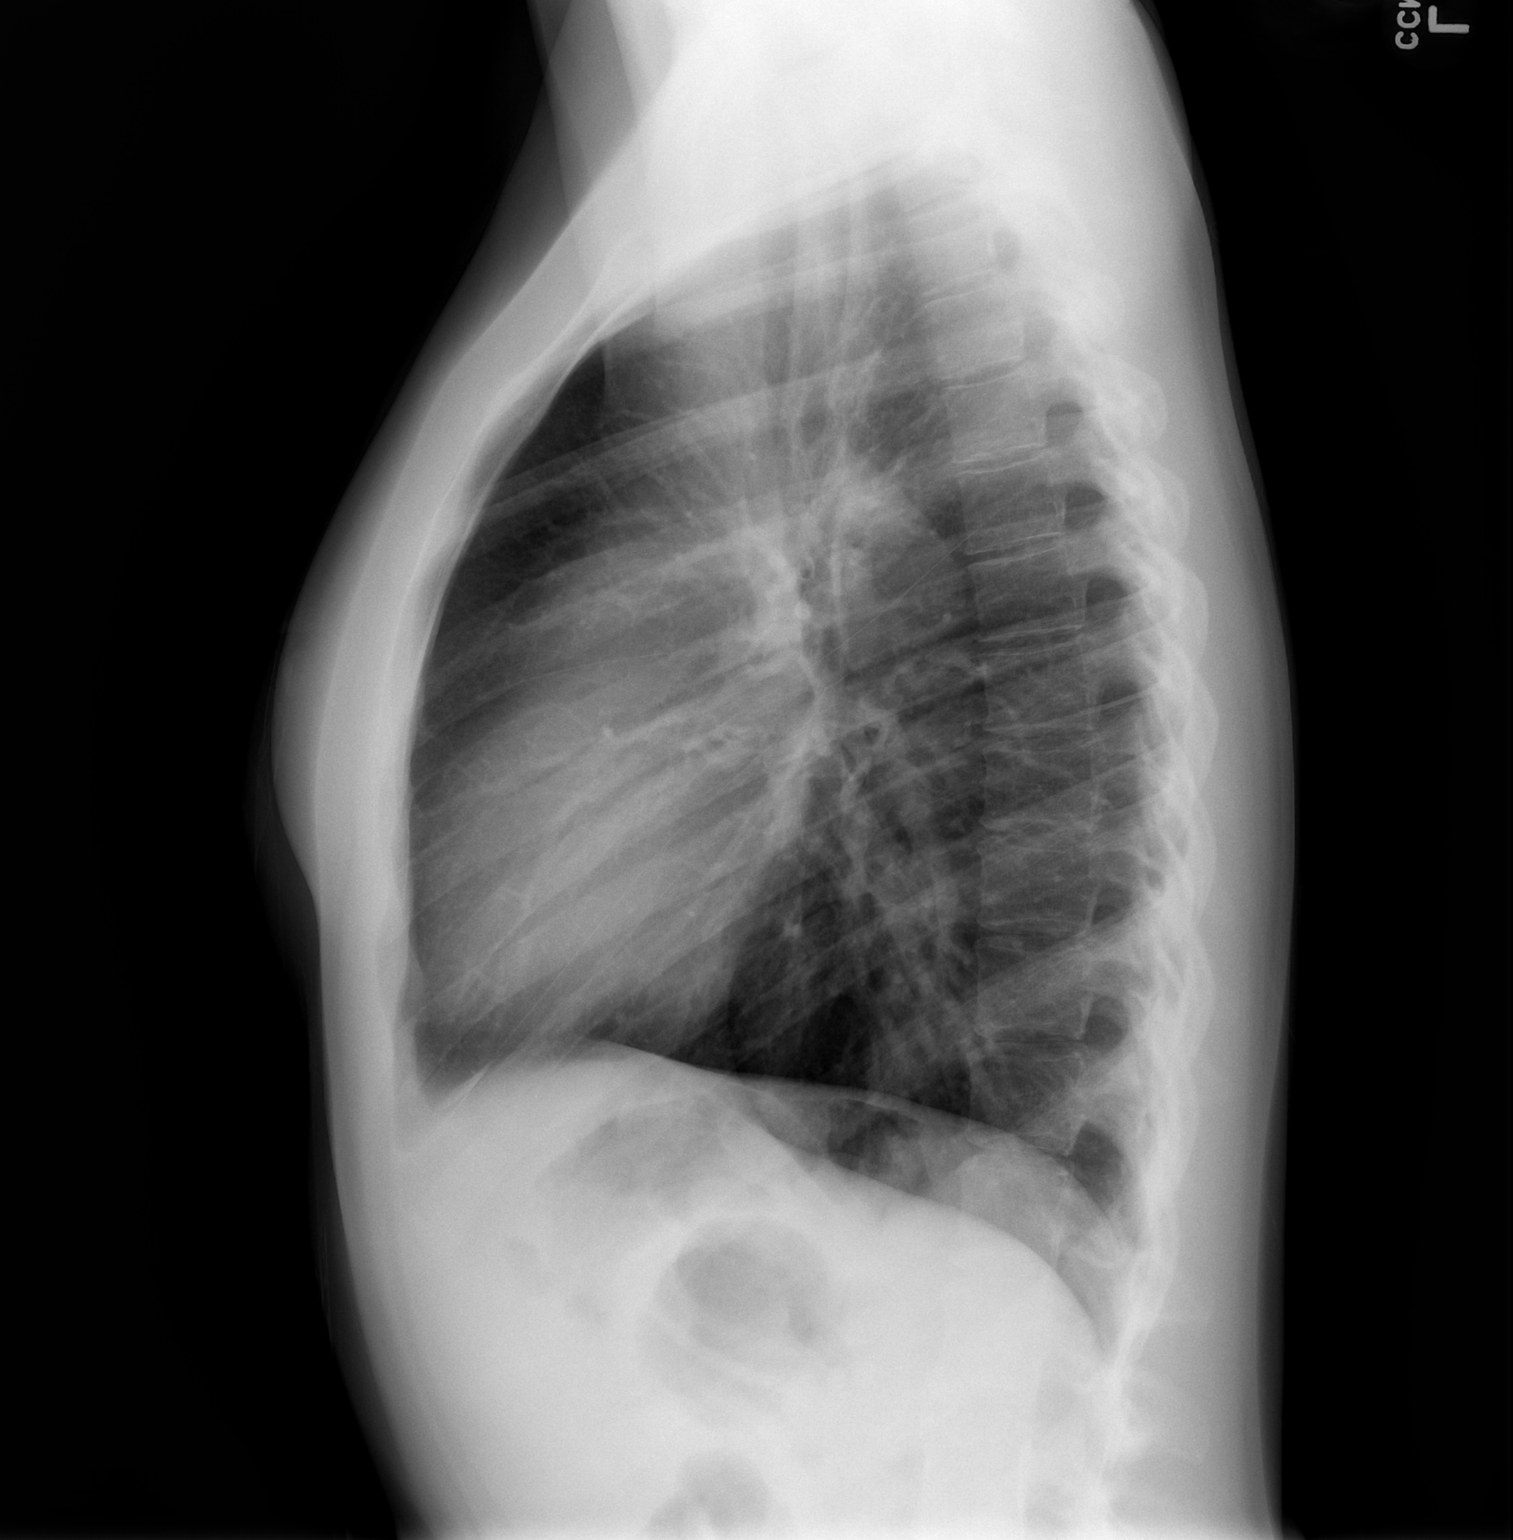

[2 of 2 positions shown; findings below may reference images not displayed]

FINDINGS: The cardiac silhouette, mediastinal and hilar contours are normal.
The lungs are clear. No pleural effusion. The bony thorax is intact.
IMPRESSION: No acute cardiopulmonary findings.

## 2019-06-22 ENCOUNTER — Encounter (HOSPITAL_COMMUNITY): Payer: Self-pay | Admitting: Emergency Medicine

## 2019-06-22 ENCOUNTER — Other Ambulatory Visit: Payer: Self-pay

## 2019-06-22 ENCOUNTER — Ambulatory Visit (HOSPITAL_COMMUNITY)
Admission: EM | Admit: 2019-06-22 | Discharge: 2019-06-22 | Disposition: A | Discharge status: Home / Self Care | Payer: BC Managed Care – PPO | Attending: Internal Medicine | Admitting: Internal Medicine

## 2019-06-22 DIAGNOSIS — K068 Other specified disorders of gingiva and edentulous alveolar ridge: Secondary | ICD-10-CM

## 2019-06-22 NOTE — ED Provider Notes (Signed)
[facility]    CSN: [id number] Arrival date & time: 06/22/19  1726      History   Chief Complaint Chief Complaint  Patient presents with  . Bleeding/Bruising    HPI Suzanne Mays is a 28 y.o. female with no past medical history comes to urgent care with complaints of bleeding gums.  Bleeding started sometime this afternoon.  Patient underwent deep cleaning of her teeth a couple of days ago.  She did well yesterday with no bleeding.  This afternoon patient started bleeding after she ate lunch.  The bleeding was profuse and had clots.  No areas of bruising besides gum around right lower incisor.  She has some gum recession in that area.  Patient denies any pain with the bleeding.  No nausea or vomiting.  HPI  History reviewed. No pertinent past medical history.  Patient Active Problem List   Diagnosis Date Noted  . Palpitations 08/07/2014  . Costochondritis 08/07/2014  . Sleep deficient 08/07/2014  . Travel foreign 05/30/2013  . Musculoskeletal pain 01/16/2013  . Depression 11/14/2012  . Abnormal Pap smear of vagina 10/05/2012  . Dysmenorrhea 10/03/2012  . Routine general medical examination at a health care facility 09/07/2012    Past Surgical History:  Procedure Laterality Date  . WISDOM TOOTH EXTRACTION      OB History    Gravida  0   Para      Term      Preterm      AB      Living        SAB      TAB      Ectopic      Multiple      Live Births               Home Medications    Prior to Admission medications   Medication Sig Start Date End Date Taking? Authorizing Provider  Multiple Vitamins-Minerals (CENTRUM PO) Take 1 tablet by mouth daily.    [provider]  norethindrone-ethinyl estradiol-iron (MICROGESTIN FE,GILDESS FE,LOESTRIN FE) 1.5-30 MG-MCG tablet Take 1 tablet by mouth daily. 11/17/13   Sandford Craze'Sullivan, Melissa, NP    Family History Family History  Problem Relation Age of Onset  . Hypertension Mother   .  Diabetes Mother   . Stroke Maternal Grandmother   . Cancer Maternal Grandmother        breast & brain    Social History Social History   Tobacco Use  . Smoking status: Never Smoker  . Smokeless tobacco: Never Used  Substance Use Topics  . Alcohol use: No  . Drug use: No     Allergies   Zoloft [sertraline hcl]   Review of Systems Review of Systems  HENT: Positive for dental problem.   Respiratory: Negative.   Gastrointestinal: Negative.   Musculoskeletal: Negative.   Neurological: Negative.  Negative for dizziness, facial asymmetry and light-headedness.  Hematological: Negative for adenopathy. Bruises/bleeds easily.     Physical Exam Triage Vital Signs ED Triage Vitals  Enc Vitals Group     BP 06/22/19 1752 112/65     Pulse Rate 06/22/19 1752 96     Resp 06/22/19 1752 18     Temp 06/22/19 1752 98.2 F (36.8 C)     Temp Source 06/22/19 1752 Oral     SpO2 06/22/19 1752 95 %     Weight --      Height --      Head Circumference --  Peak Flow --      Pain Score 06/22/19 1753 0     Pain Loc --      Pain Edu? --      Excl. in [place]? --    No data found.  Updated Vital Signs BP 112/65 (BP Location: Right Arm)   Pulse 96   Temp 98.2 F (36.8 C) (Oral)   Resp 18   SpO2 95%   Visual Acuity Right Eye Distance:   Left Eye Distance:   Bilateral Distance:    Right Eye Near:   Left Eye Near:    Bilateral Near:     Physical Exam Vitals signs and nursing note reviewed.  Constitutional:      Appearance: She is not ill-appearing or toxic-appearing.  HENT:     Mouth/Throat:     Comments: Bleeding from the gums surrounding the right lower incisor.  Blood clot noted around the gumline of the right lower incisor. Eyes:     Conjunctiva/sclera: Conjunctivae normal.  Cardiovascular:     Pulses: Normal pulses.     Heart sounds: Normal heart sounds.  Abdominal:     General: Bowel sounds are normal.     Palpations: Abdomen is soft.  Skin:    Capillary Refill:  Capillary refill takes less than 2 seconds.  Neurological:     General: No focal deficit present.     Mental Status: She is alert and oriented to person, place, and time.      UC Treatments / Results  Labs (all labs ordered are listed, but only abnormal results are displayed) Labs Reviewed - No data to display  EKG   Radiology No results found.  Procedures Procedures (including critical care time)  Medications Ordered in UC Medications - No data to display  Initial Impression / Assessment and Plan / UC Course  I have reviewed the triage vital signs and the nursing notes.  Pertinent labs & imaging results that were available during my care of the patient were reviewed by me and considered in my medical decision making (see chart for details).     1.  Gum bleeding following deep cleaning: Warm salt water mouth rinse. Avoid brushing teeth tonight. He can brush teeth tomorrow and avoid the area that is bleeding. If bleeding continues, call the dentist office to be reevaluated. Final Clinical Impressions(s) / UC Diagnoses   Final diagnoses:  Gums, bleeding   Discharge Instructions   None    ED Prescriptions    None     Controlled Substance Prescriptions [place] Controlled Substance Registry consulted? No   Chase Picket, MD 06/22/19 [phone]

## 2019-06-22 NOTE — ED Triage Notes (Signed)
Pt sts had "deep cleaning" at dentist 2 days ago and still having bleeding; bleeding noted at present to right side of mouth; pt denies pain

## 2019-09-07 ENCOUNTER — Encounter: Payer: Self-pay | Admitting: Family

## 2019-09-18 ENCOUNTER — Other Ambulatory Visit: Payer: Self-pay

## 2019-09-19 ENCOUNTER — Ambulatory Visit: Payer: BC Managed Care – PPO | Admitting: Family

## 2019-09-19 ENCOUNTER — Other Ambulatory Visit (HOSPITAL_COMMUNITY)
Admission: RE | Admit: 2019-09-19 | Discharge: 2019-09-19 | Disposition: A | Discharge status: Home / Self Care | Payer: BC Managed Care – PPO | Source: Ambulatory Visit | Attending: Family | Admitting: Family

## 2019-09-19 ENCOUNTER — Other Ambulatory Visit: Payer: Self-pay

## 2019-09-19 VITALS — BP 96/58 | HR 62 | Temp 97.2°F | Resp 16 | Ht 64.0 in | Wt 124.0 lb

## 2019-09-19 DIAGNOSIS — Z113 Encounter for screening for infections with a predominantly sexual mode of transmission: Secondary | ICD-10-CM | POA: Diagnosis not present

## 2019-09-19 NOTE — Patient Instructions (Signed)
Please complete lab work prior to leaving.   

## 2019-09-19 NOTE — Progress Notes (Signed)
Subjective:    Patient ID: Suzanne Mays, female    DOB: [birth year], 28 y.o.   MRN: [id number]  HPI  Patient is a 28 yr old female who presents today requesting STD screening. She denies new partners or  Unprotected sex.  Denies any current symptoms. "I just want to be checked."    She reports that she is using condoms for birth control.  She states that she stopped her OCP due to headaches and since she discontinued OCP headaches have resolved. She is happy with her current method of condoms.  Review of Systems See HPI  No past medical history on file.   Social History   Socioeconomic History  . Marital status: Single    Spouse name: Not on file  . Number of children: Not on file  . Years of education: Not on file  . Highest education level: Not on file  Occupational History  . Not on file  Social Needs  . Financial resource strain: Not on file  . Food insecurity    Worry: Not on file    Inability: Not on file  . Transportation needs    Medical: Not on file    Non-medical: Not on file  Tobacco Use  . Smoking status: Never Smoker  . Smokeless tobacco: Never Used  Substance and Sexual Activity  . Alcohol use: No  . Drug use: No  . Sexual activity: Yes    Birth control/protection: Pill    Comment: JUNEL   Lifestyle  . Physical activity    Days per week: Not on file    Minutes per session: Not on file  . Stress: Not on file  Relationships  . Social Herbalist on phone: Not on file    Gets together: Not on file    Attends religious service: Not on file    Active member of club or organization: Not on file    Attends meetings of clubs or organizations: Not on file    Relationship status: Not on file  . Intimate partner violence    Fear of current or ex partner: Not on file    Emotionally abused: Not on file    Physically abused: Not on file    Forced sexual activity: Not on file  Other Topics Concern  . 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 WISDOM TOOTH EXTRACTION      Family History  Problem Relation Age of Onset  . Hypertension Mother   . Diabetes Mother   . Stroke Maternal Grandmother   . Cancer Maternal Grandmother        breast & brain    Allergies  Allergen Reactions  . Zoloft [Sertraline Hcl]     Somnolence    No current outpatient medications on file prior to visit.   No current facility-administered medications on file prior to visit.     BP (!) 96/58 (BP Location: Left Arm, Patient Position: Sitting, Cuff Size: Small)   Pulse 62   Temp (!) 97.2 F (36.2 C) (Temporal)   Resp 16   Ht 5\' 4"  (1.626 m)  Wt 124 lb (56.2 kg)   SpO2 100%   BMI 21.28 kg/m       Objective:   Physical Exam Constitutional:      Appearance: She is well-developed.  Neck:     Musculoskeletal: Neck supple.     Thyroid: No thyromegaly.  Cardiovascular:     Rate and Rhythm: Normal rate and regular rhythm.     Heart sounds: Normal heart sounds. No murmur.  Pulmonary:     Effort: Pulmonary effort is normal. No respiratory distress.     Breath sounds: Normal breath sounds. No wheezing.  Skin:    General: Skin is warm and dry.  Neurological:     Mental Status: She is alert and oriented to person, place, and time.  Psychiatric:        Behavior: Behavior normal.        Thought Content: Thought content normal.        Judgment: Judgment normal.           Assessment & Plan:  STD screening- check HIV, RPR, HSV2, urine ancillary testing for GC/Chlamydia, trichomonas. Encouraged continued condom use.

## 2019-09-20 LAB — RPR: RPR Ser Ql: NONREACTIVE

## 2019-09-20 LAB — HSV 2 ANTIBODY, IGG: HSV 2 Glycoprotein G Ab, IgG: <0.9 index

## 2019-09-20 LAB — HIV ANTIBODY (ROUTINE TESTING W REFLEX): HIV 1&2 Ab, 4th Generation: NONREACTIVE

## 2019-09-21 LAB — URINE CYTOLOGY ANCILLARY ONLY
Chlamydia: NEGATIVE
Comment: NEGATIVE
Comment: NEGATIVE
Comment: NORMAL
Neisseria Gonorrhea: NEGATIVE
Trichomonas: NEGATIVE

## 2020-02-02 ENCOUNTER — Other Ambulatory Visit: Payer: Self-pay

## 2020-02-05 ENCOUNTER — Ambulatory Visit: Payer: BC Managed Care – PPO | Admitting: Family

## 2020-02-05 ENCOUNTER — Other Ambulatory Visit: Payer: Self-pay

## 2020-02-05 ENCOUNTER — Other Ambulatory Visit (HOSPITAL_COMMUNITY)
Admission: RE | Admit: 2020-02-05 | Discharge: 2020-02-05 | Disposition: A | Discharge status: Home / Self Care | Payer: BC Managed Care – PPO | Source: Ambulatory Visit | Attending: Family | Admitting: Family

## 2020-02-05 ENCOUNTER — Encounter: Payer: Self-pay | Admitting: Family

## 2020-02-05 VITALS — BP 122/70 | HR 70 | Resp 16 | Ht 64.0 in | Wt 128.0 lb

## 2020-02-05 DIAGNOSIS — Z113 Encounter for screening for infections with a predominantly sexual mode of transmission: Secondary | ICD-10-CM | POA: Insufficient documentation

## 2020-02-05 DIAGNOSIS — Z8659 Personal history of other mental and behavioral disorders: Secondary | ICD-10-CM

## 2020-02-05 DIAGNOSIS — Z0000 Encounter for general adult medical examination without abnormal findings: Secondary | ICD-10-CM | POA: Diagnosis not present

## 2020-02-05 LAB — IRON: Iron: 71 ug/dL (ref 42–145)

## 2020-02-05 LAB — TSH: TSH: 1.39 u[IU]/mL (ref 0.35–4.50)

## 2020-02-05 NOTE — Progress Notes (Signed)
Subjective:    Patient ID: Suzanne Mays, female    DOB: 11-01-1990, 29 y.o.   MRN: [id number]  HPI  Patient is a 29 yr old female who presents today with two concerns.  She has a history of depression which she reports is well controlled, but she recently started with a counselor just to help with stress management.   She is working [employer] ([employer] in [place]). Her counselor has asked her to have the following labs drawn:  Vit D, Thyroid, Iron.   She is also interested in STD testing. Reports that she has been sexually active, using protection. Denies vaginal discharge or irritation.   Review of Systems    see HPI  No past medical history on file.   Social History   Socioeconomic History  . Marital status: Single    Spouse name: Not on file  . Number of children: Not on file  . Years of education: Not on file  . Highest education level: Not on file  Occupational History  . Not on file  Tobacco Use  . Smoking status: Never Smoker  . Smokeless tobacco: Never Used  Substance and Sexual Activity  . Alcohol use: No  . Drug use: No  . Sexual activity: Yes    Birth control/protection: Pill    Comment: JUNEL   Other Topics Concern  . 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   Works at [employer] as Scientist, research (medical) of Radio broadcast assistant Strain:   . Difficulty of Paying Living Expenses:   Food Insecurity:   . Worried About Charity fundraiser in the Last Year:   . Arboriculturist in the Last Year:   Transportation Needs:   . Film/video editor (Medical):   Marland Kitchen Lack of Transportation (Non-Medical):   Physical Activity:   . Days of Exercise per Week:   . Minutes of Exercise per Session:   Stress:   . Feeling of Stress :    Social Connections:   . Frequency of Communication with Friends and Family:   . Frequency of Social Gatherings with Friends and Family:   . Attends Religious Services:   . Active Member of Clubs or Organizations:   . Attends Archivist Meetings:   Marland Kitchen Marital Status:   Intimate Partner Violence:   . Fear of Current or Ex-Partner:   . Emotionally Abused:   Marland Kitchen Physically Abused:   . Sexually Abused:     Past Surgical History:  Procedure Laterality Date  . WISDOM TOOTH EXTRACTION      Family History  Problem Relation Age of Onset  . Hypertension Mother   . Diabetes Mother   . Stroke Maternal Grandmother   . Cancer Maternal Grandmother        breast & brain    Allergies  Allergen Reactions  . Zoloft [Sertraline Hcl]     Somnolence    No current outpatient medications on file prior to visit.   No current facility-administered medications on file prior to visit.    BP 122/70 (BP Location: Right Arm, Patient Position: Sitting, Cuff Size: Normal)   Pulse 70   Resp 16   Ht  5\' 4"  (1.626 m)   Wt 128 lb (58.1 kg)   SpO2 97%   BMI 21.97 kg/m    Objective:   Physical Exam Constitutional:      Appearance: She is well-developed.  Neck:     Thyroid: No thyromegaly.  Cardiovascular:     Rate and Rhythm: Normal rate and regular rhythm.     Heart sounds: Normal heart sounds. No murmur.  Pulmonary:     Effort: Pulmonary effort is normal. No respiratory distress.     Breath sounds: Normal breath sounds. No wheezing.  Musculoskeletal:     Cervical back: Neck supple.  Skin:    General: Skin is warm and dry.  Neurological:     Mental Status: She is alert and oriented to person, place, and time.  Psychiatric:        Behavior: Behavior normal.        Thought Content: Thought content normal.        Judgment: Judgment normal.           Assessment & Plan:  STD- testing. Will obtain HIV, RPR, HSV, Gonorrhea, chlamydia, trich.   History of depression-  clinically stable.  Labs ordered at pt request. (we discussed that these labs may not be covered by her insurance). She plans to continue to work with the therapist.  This visit occurred during the SARS-CoV-2 public health emergency.  Safety protocols were in place, including screening questions prior to the visit, additional usage of staff PPE, and extensive cleaning of exam room while observing appropriate contact time as indicated for disinfecting solutions.

## 2020-02-05 NOTE — Patient Instructions (Signed)
Please complete lab work prior to leaving.   

## 2020-02-06 LAB — URINE CYTOLOGY ANCILLARY ONLY
Chlamydia: NEGATIVE
Comment: NEGATIVE
Comment: NEGATIVE
Comment: NORMAL
Neisseria Gonorrhea: NEGATIVE
Trichomonas: NEGATIVE

## 2020-02-08 LAB — HIV ANTIBODY (ROUTINE TESTING W REFLEX): HIV 1&2 Ab, 4th Generation: NONREACTIVE

## 2020-02-08 LAB — VITAMIN D 1,25 DIHYDROXY
Vitamin D 1, 25 (OH)2 Total: 37 pg/mL (ref 18–72)
Vitamin D2 1, 25 (OH)2: <8 pg/mL
Vitamin D3 1, 25 (OH)2: 37 pg/mL

## 2020-02-08 LAB — RPR: RPR Ser Ql: NONREACTIVE

## 2020-02-08 LAB — HSV 2 ANTIBODY, IGG: HSV 2 Glycoprotein G Ab, IgG: <0.9 index

## 2020-10-31 ENCOUNTER — Other Ambulatory Visit: Payer: BC Managed Care – PPO

## 2021-06-15 ENCOUNTER — Emergency Department (HOSPITAL_BASED_OUTPATIENT_CLINIC_OR_DEPARTMENT_OTHER)
Admission: EM | Admit: 2021-06-15 | Discharge: 2021-06-15 | Disposition: A | Discharge status: Home / Self Care | Payer: No Typology Code available for payment source | Attending: Emergency Medicine | Admitting: Emergency Medicine

## 2021-06-15 ENCOUNTER — Other Ambulatory Visit: Payer: Self-pay

## 2021-06-15 ENCOUNTER — Encounter (HOSPITAL_BASED_OUTPATIENT_CLINIC_OR_DEPARTMENT_OTHER): Payer: Self-pay | Admitting: Emergency Medicine

## 2021-06-15 DIAGNOSIS — J029 Acute pharyngitis, unspecified: Secondary | ICD-10-CM | POA: Insufficient documentation

## 2021-06-15 DIAGNOSIS — Z5321 Procedure and treatment not carried out due to patient leaving prior to being seen by health care provider: Secondary | ICD-10-CM | POA: Diagnosis not present

## 2021-06-15 NOTE — ED Notes (Signed)
No answer when called 

## 2021-06-15 NOTE — ED Triage Notes (Signed)
Pt reports sore throat onset Friday. Pt denies exposure to others with illness.

## 2021-06-18 ENCOUNTER — Other Ambulatory Visit: Payer: Self-pay

## 2021-06-18 ENCOUNTER — Emergency Department (HOSPITAL_BASED_OUTPATIENT_CLINIC_OR_DEPARTMENT_OTHER)
Admission: EM | Admit: 2021-06-18 | Discharge: 2021-06-18 | Disposition: A | Discharge status: Home / Self Care | Payer: No Typology Code available for payment source | Attending: Emergency Medicine | Admitting: Emergency Medicine

## 2021-06-18 ENCOUNTER — Encounter (HOSPITAL_BASED_OUTPATIENT_CLINIC_OR_DEPARTMENT_OTHER): Payer: Self-pay

## 2021-06-18 DIAGNOSIS — Z20822 Contact with and (suspected) exposure to covid-19: Secondary | ICD-10-CM | POA: Diagnosis not present

## 2021-06-18 DIAGNOSIS — Z91040 Latex allergy status: Secondary | ICD-10-CM | POA: Insufficient documentation

## 2021-06-18 DIAGNOSIS — J069 Acute upper respiratory infection, unspecified: Secondary | ICD-10-CM | POA: Diagnosis not present

## 2021-06-18 DIAGNOSIS — R0981 Nasal congestion: Secondary | ICD-10-CM | POA: Diagnosis present

## 2021-06-18 LAB — SARS CORONAVIRUS 2 (TAT 6-24 HRS): SARS Coronavirus 2: NEGATIVE

## 2021-06-18 MED ORDER — FLUTICASONE PROPIONATE 50 MCG/ACT NA SUSP: 1.0000 | Freq: Every day | NASAL | 0 refills | Status: DC

## 2021-06-18 MED ORDER — BENZONATATE 100 MG PO CAPS: 100.0000 mg | ORAL_CAPSULE | Freq: Three times a day (TID) | ORAL | 0 refills | Status: DC

## 2021-06-18 NOTE — ED Provider Notes (Signed)
[facility] EMERGENCY DEPARTMENT Provider Note   CSN: [id number] Arrival date & time: 06/18/21  0840     History Chief Complaint  Patient presents with   Nasal Congestion   Cough    Suzanne Mays is a 30 y.o. female presenting for evaluation of nasal congestion, cough, sinus pressure.  Patient states her symptoms have been going on for 5 days.  They are not worsening, but also not improving.  She has been trying over-the-counter measures including DayQuil, NyQuil, Robitussin without improvement of symptoms.  She reports nasal congestion and postnasal drip.  She is having a cough productive of phlegm.  No chest pain or shortness of breath.  No fevers, chills, sore throat, nausea vomiting or abdominal pain.  No sick contacts.  She has taken a home antigen COVID test which was negative today.  She has no medical problems, takes no medications daily.  HPI     History reviewed. No pertinent past medical history.  Patient Active Problem List   Diagnosis Date Noted   Palpitations 08/07/2014   Costochondritis 08/07/2014   Sleep deficient 08/07/2014   Travel foreign 05/30/2013   Musculoskeletal pain 01/16/2013   Depression 11/14/2012   Abnormal Pap smear of vagina 10/05/2012   Dysmenorrhea 10/03/2012   Routine general medical examination at a health care facility 09/07/2012    Past Surgical History:  Procedure Laterality Date   WISDOM TOOTH EXTRACTION       OB History     Gravida  0   Para      Term      Preterm      AB      Living         SAB      IAB      Ectopic      Multiple      Live Births              Family History  Problem Relation Age of Onset   Hypertension Mother    Diabetes Mother    Stroke Maternal Grandmother    Cancer Maternal Grandmother        breast & brain    Social History   Tobacco Use   Smoking status: Never   Smokeless tobacco: Never  Vaping Use   Vaping Use: Some days   Devices: hookah  Substance Use  Topics   Alcohol use: Yes   Drug use: No    Home Medications Prior to Admission medications   Medication Sig Start Date End Date Taking? Authorizing Provider  fluticasone (FLONASE) 50 MCG/ACT nasal spray Place 1 spray into both nostrils daily. 06/18/21  Yes [facility], PA-C    Allergies    Latex and Zoloft [sertraline hcl]  Review of Systems   Review of Systems  HENT:  Positive for congestion, postnasal drip, sinus pressure and sore throat (resolved).   Respiratory:  Positive for cough.   All other systems reviewed and are negative.  Physical Exam Updated Vital Signs BP 101/60 (BP Location: Left Arm)   Pulse 79   Temp 98.8 F (37.1 C) (Oral)   Resp 16   Ht 5\' 4"  (1.626 m)   Wt 59 kg   LMP 05/19/2021 (Exact Date)   SpO2 100%   BMI 22.31 kg/m   Physical Exam Vitals and nursing note reviewed.  Constitutional:      General: She is not in acute distress.    Appearance: Normal appearance.     Comments: Resting comfortably  in the bed in no acute distress  HENT:     Head: Normocephalic and atraumatic.     Right Ear: Tympanic membrane, ear canal and external ear normal.     Left Ear: Tympanic membrane, ear canal and external ear normal.     Nose: Mucosal edema, congestion and rhinorrhea present.     Right Sinus: No maxillary sinus tenderness or frontal sinus tenderness.     Left Sinus: No maxillary sinus tenderness or frontal sinus tenderness.     Comments: Nasal mucosal edema with clear rhinorrhea and congestion.  No sinus tenderness    Mouth/Throat:     Pharynx: Uvula midline.     Comments: OP clear without tonsillar swelling or exudates.  Uvula midline. Eyes:     Extraocular Movements: Extraocular movements intact.     Conjunctiva/sclera: Conjunctivae normal.     Pupils: Pupils are equal, round, and reactive to light.  Cardiovascular:     Rate and Rhythm: Normal rate and regular rhythm.     Pulses: Normal pulses.  Pulmonary:     Effort: Pulmonary effort is  normal.     Breath sounds: Normal breath sounds. No decreased breath sounds, wheezing, rhonchi or rales.     Comments: Speaking in full sentences.  Clear lung sounds in all fields.  Sats stable on room air. Abdominal:     General: There is no distension.     Palpations: Abdomen is soft.     Tenderness: There is no abdominal tenderness.  Musculoskeletal:        General: Normal range of motion.     Cervical back: Normal range of motion.  Lymphadenopathy:     Cervical: No cervical adenopathy.  Skin:    General: Skin is warm.     Capillary Refill: Capillary refill takes less than 2 seconds.  Neurological:     Mental Status: She is alert and oriented to person, place, and time.    ED Results / Procedures / Treatments   Labs (all labs ordered are listed, but only abnormal results are displayed) Labs Reviewed  SARS CORONAVIRUS 2 (TAT 6-24 HRS)    EKG None  Radiology No results found.  Procedures Procedures   Medications Ordered in ED Medications - No data to display  ED Course  I have reviewed the triage vital signs and the nursing notes.  Pertinent labs & imaging results that were available during my care of the patient were reviewed by me and considered in my medical decision making (see chart for details).    MDM Rules/Calculators/A&P                           Patient presenting with 5 day h/o URI symptoms.  Physical exam reassuring, patient is afebrile and appears nontoxic.  Pulmonary exam reassuring.  Doubt pneumonia, strep, other bacterial infection, or peritonsillar abscess.   Likely viral URI, will order PCR covid test.  Will treat symptomatically.  Patient to follow-up with primary care as needed.  At this time, patient appears safe for discharge.  Return precautions given.  Patient states she understands and agrees to plan.  Suzanne Mays was evaluated in Emergency Department on 06/18/2021 for the symptoms described in the history of present illness. She was  evaluated in the context of the global COVID-19 pandemic, which necessitated consideration that the patient might be at risk for infection with the SARS-CoV-2 virus that causes COVID-19. Institutional protocols and algorithms that pertain to the  evaluation of patients at risk for COVID-19 are in a state of rapid change based on information released by regulatory bodies including the CDC and federal and state organizations. These policies and algorithms were followed during the patient's care in the ED.   Final Clinical Impression(s) / ED Diagnoses Final diagnoses:  Upper respiratory tract infection, unspecified type    Rx / DC Orders ED Discharge Orders          Ordered    fluticasone (FLONASE) 50 MCG/ACT nasal spray  Daily        06/18/21 1043             Shinichi Anguiano, PA-C 06/18/21 1101    Derwood Kaplan, MD 06/19/21 [phone]

## 2021-06-18 NOTE — Discharge Instructions (Addendum)
You likely have a viral illness.  This will be treated symptomatically. Use Tylenol or ibuprofen as needed for headaches or pain. Use Flonase daily for nasal congestion and cough. Take tessalon to help with cough.  Use a humidifier/steam for symptom control.  Make sure you stay well-hydrated with water. Wash your hands frequently to prevent spread of infection. Follow-up with your primary care doctor in 1 week if your symptoms are not improving. Return to the emergency room if you develop chest pain, difficulty breathing, or any new or worsening symptoms.  You were tested for COVID.  The most reliable way to find the results is to look on MyChart.

## 2021-06-18 NOTE — ED Triage Notes (Signed)
Patient reports cough, sinus congestion, runny nose, productive cough, negative home covid test.

## 2021-12-11 LAB — HM PAP SMEAR

## 2022-01-09 ENCOUNTER — Telehealth: Payer: Self-pay | Admitting: Family

## 2022-01-09 NOTE — Telephone Encounter (Signed)
OK with me.

## 2022-01-09 NOTE — Telephone Encounter (Signed)
Pt called and said she would like to transfer care from [facility] to [facility] because her mom sees [facility]. Is this ok? ?

## 2022-01-13 NOTE — Telephone Encounter (Signed)
Tried to call to schedule, unable to leave a message

## 2022-01-14 ENCOUNTER — Encounter (HOSPITAL_BASED_OUTPATIENT_CLINIC_OR_DEPARTMENT_OTHER): Payer: Self-pay

## 2022-01-14 ENCOUNTER — Emergency Department (HOSPITAL_BASED_OUTPATIENT_CLINIC_OR_DEPARTMENT_OTHER)
Admission: EM | Admit: 2022-01-14 | Discharge: 2022-01-14 | Disposition: A | Discharge status: Home / Self Care | Payer: No Typology Code available for payment source | Attending: Emergency Medicine | Admitting: Emergency Medicine

## 2022-01-14 ENCOUNTER — Other Ambulatory Visit: Payer: Self-pay

## 2022-01-14 DIAGNOSIS — J069 Acute upper respiratory infection, unspecified: Secondary | ICD-10-CM | POA: Insufficient documentation

## 2022-01-14 DIAGNOSIS — Z20822 Contact with and (suspected) exposure to covid-19: Secondary | ICD-10-CM | POA: Insufficient documentation

## 2022-01-14 DIAGNOSIS — Z91040 Latex allergy status: Secondary | ICD-10-CM | POA: Insufficient documentation

## 2022-01-14 LAB — RESP PANEL BY RT-PCR (FLU A&B, COVID) ARPGX2
Influenza A by PCR: NEGATIVE
Influenza B by PCR: NEGATIVE
SARS Coronavirus 2 by RT PCR: NEGATIVE

## 2022-01-14 NOTE — ED Provider Notes (Signed)
?[facility] EMERGENCY DEPARTMENT ?Provider Note ? ? ?CSN: [id number] ?Arrival date & time: 01/14/22  1618 ? ?  ? ?History ?Chief Complaint  ?Patient presents with  ? URI  ? ? ?Suzanne Mays is a 31 y.o. female. ? ?31 year old female with no past medical history presents to the ED with 3 days of sinus pressure, congestion.  She reports feeling significant pressure to the left side of the maxillary area, no improvement despite hot tea, hot soups, over-the-counter medication.  She is not endorsing any other symptoms.  No prior history of asthma.  Symptoms are exacerbated at night when lying flat.  No fever, no chest pain, no shortness of breath. ? ?The history is provided by the patient.  ?URI ?Presenting symptoms: congestion   ?Presenting symptoms: no fever   ?Severity:  Moderate ?Onset quality:  Gradual ?Duration:  3 days ?Timing:  Constant ?Progression:  Unchanged ?Chronicity:  New ? ?  ? ?Home Medications ?Prior to Admission medications   ?Medication Sig Start Date End Date Taking? Authorizing Provider  ?benzonatate (TESSALON) 100 MG capsule Take 1 capsule (100 mg total) by mouth every 8 (eight) hours. 06/18/21   Caccavale, Sophia, PA-C  ?fluticasone (FLONASE) 50 MCG/ACT nasal spray Place 1 spray into both nostrils daily. 06/18/21   Caccavale, Sophia, PA-C  ?   ? ?Allergies    ?Latex and Zoloft [sertraline hcl]   ? ?Review of Systems   ?Review of Systems  ?Constitutional:  Negative for chills and fever.  ?HENT:  Positive for congestion.   ?Respiratory:  Negative for shortness of breath.   ?Cardiovascular:  Negative for chest pain.  ? ?Physical Exam ?Updated Vital Signs ?BP 115/77 (BP Location: Left Arm)   Pulse 75   Temp 98.2 ?F (36.8 ?C) (Oral)   Resp 18   Ht 5\' 4"  (1.626 m)   Wt 67.1 kg   LMP 01/10/2022   SpO2 98%   BMI 25.40 kg/m?  ?Physical Exam ?Vitals and nursing note reviewed.  ?Constitutional:   ?   Appearance: Normal appearance.  ?HENT:  ?   Head: Normocephalic and atraumatic.  ?   Nose:  ?    Left Sinus: Maxillary sinus tenderness present.  ?   Comments: Tenderness to palpation on the left maxillary area.  The rest of the areas are spared. ?   Mouth/Throat:  ?   Mouth: Mucous membranes are moist.  ?   Comments: Tonsillar exudate, no PTA, no erythema noted. ?Eyes:  ?   Pupils: Pupils are equal, round, and reactive to light.  ?Pulmonary:  ?   Effort: Pulmonary effort is normal.  ?   Comments: Lungs are clear to auscultation with no wheezing, rhonchi, rales. ?Abdominal:  ?   General: Abdomen is flat.  ?Musculoskeletal:  ?   Cervical back: Normal range of motion and neck supple.  ?Skin: ?   General: Skin is warm and dry.  ?Neurological:  ?   Mental Status: She is alert and oriented to person, place, and time.  ? ? ?ED Results / Procedures / Treatments   ?Labs ?(all labs ordered are listed, but only abnormal results are displayed) ?Labs Reviewed  ?RESP PANEL BY RT-PCR (FLU A&B, COVID) ARPGX2  ? ? ?EKG ?None ? ?Radiology ?No results found. ? ?Procedures ?Procedures  ? ? ?Medications Ordered in ED ?Medications - No data to display ? ?ED Course/ Medical Decision Making/ A&P ?  ?                        ?  Medical Decision Making ? ? ?Patient presents to the ED with a chief complaint of sinus pressure for the past 3 days with no improvement with over-the-counter medication.  On arrival patient is afebrile, no hypoxia, no tachycardia.  No underlying history of asthma.  Currently on no OCPs. ? ?During evaluation there is significant tenderness to palpation along the left maxillary area, does appear to be enlarged.  She has not tried any over-the-counter medication, aside from some home remedies such as hot tea, hot soup or temporary relief.  Lungs are clear to auscultation.  Respiratory panel was negative on today's visit. ? ?We discussed continued decongestion, continue symptomatic treatment.  Will need to follow-up with PCP as needed. ? ?Portions of this note were generated with Scientist, clinical (histocompatibility and immunogenetics). Dictation  errors may occur despite best attempts at proofreading.   ?Final Clinical Impression(s) / ED Diagnoses ?Final diagnoses:  ?Viral upper respiratory tract infection  ? ? ?Rx / DC Orders ?ED Discharge Orders   ? ? None  ? ?  ? ? ?  ?Claude Manges, PA-C ?01/14/22 1847 ? ?  ?Charlynne Pander, MD ?01/14/22 2341 ? ?

## 2022-01-14 NOTE — Discharge Instructions (Addendum)
Your respiratory panel on today's visit was negative for influenza A, B, COVID-19. ? ?You may benefit from an over-the-counter decongestion to help with your symptoms. ? ?If you experience any shortness of breath, worsening symptoms you will need to return to the emergency department. ?

## 2022-01-14 NOTE — ED Triage Notes (Signed)
Pt c/o URI sx x 3 days-NAD-steady gait 

## 2022-03-17 ENCOUNTER — Emergency Department (HOSPITAL_BASED_OUTPATIENT_CLINIC_OR_DEPARTMENT_OTHER)
Admission: EM | Admit: 2022-03-17 | Discharge: 2022-03-17 | Disposition: A | Discharge status: Home / Self Care | Payer: Self-pay | Attending: Emergency Medicine | Admitting: Emergency Medicine

## 2022-03-17 ENCOUNTER — Other Ambulatory Visit: Payer: Self-pay

## 2022-03-17 ENCOUNTER — Encounter (HOSPITAL_BASED_OUTPATIENT_CLINIC_OR_DEPARTMENT_OTHER): Payer: Self-pay | Admitting: Emergency Medicine

## 2022-03-17 ENCOUNTER — Other Ambulatory Visit (HOSPITAL_BASED_OUTPATIENT_CLINIC_OR_DEPARTMENT_OTHER): Payer: Self-pay

## 2022-03-17 DIAGNOSIS — J069 Acute upper respiratory infection, unspecified: Secondary | ICD-10-CM | POA: Insufficient documentation

## 2022-03-17 DIAGNOSIS — J029 Acute pharyngitis, unspecified: Secondary | ICD-10-CM

## 2022-03-17 DIAGNOSIS — Z91040 Latex allergy status: Secondary | ICD-10-CM | POA: Insufficient documentation

## 2022-03-17 LAB — GROUP A STREP BY PCR: Group A Strep by PCR: NOT DETECTED

## 2022-03-17 MED ORDER — FLUTICASONE PROPIONATE 50 MCG/ACT NA SUSP
1.0000 | Freq: Every day | NASAL | 0 refills | Status: DC
  Filled 2022-03-17: qty 16, 60d supply, fill #0

## 2022-03-17 MED ORDER — LIDOCAINE VISCOUS HCL 2 % MT SOLN
15.0000 mL | Freq: Once | OROMUCOSAL | Status: AC
  Administered 2022-03-17: 15 mL via OROMUCOSAL
  Filled 2022-03-17: qty 15

## 2022-03-17 MED ORDER — KETOROLAC TROMETHAMINE 60 MG/2ML IM SOLN
60.0000 mg | Freq: Once | INTRAMUSCULAR | Status: AC
  Administered 2022-03-17: 60 mg via INTRAMUSCULAR
  Filled 2022-03-17: qty 2

## 2022-03-17 MED ORDER — GUAIFENESIN 100 MG/5ML PO LIQD
100.0000 mg | ORAL | 0 refills | Status: DC | PRN
  Filled 2022-03-17: qty 118, 2d supply, fill #0

## 2022-03-17 MED ORDER — ACETAMINOPHEN 325 MG PO TABS
650.0000 mg | ORAL_TABLET | Freq: Four times a day (QID) | ORAL | 0 refills | Status: DC | PRN
Start: 2022-03-17 — End: 2022-09-17
  Filled 2022-03-17: qty 100, 13d supply, fill #0

## 2022-03-17 MED ORDER — IBUPROFEN 600 MG PO TABS
600.0000 mg | ORAL_TABLET | Freq: Four times a day (QID) | ORAL | 0 refills | Status: DC | PRN
  Filled 2022-03-17: qty 30, 8d supply, fill #0

## 2022-03-17 MED ORDER — CETIRIZINE HCL 10 MG PO TABS
10.0000 mg | ORAL_TABLET | Freq: Every day | ORAL | 0 refills | Status: DC
  Filled 2022-03-17: qty 100, 100d supply, fill #0

## 2022-03-17 MED ORDER — PSEUDOEPHEDRINE HCL ER 120 MG PO TB12
120.0000 mg | ORAL_TABLET | Freq: Two times a day (BID) | ORAL | 0 refills | Status: AC
  Filled 2022-03-17: qty 10, 5d supply, fill #0

## 2022-03-17 NOTE — ED Provider Notes (Signed)
[facility] EMERGENCY DEPARTMENT Provider Note   CSN: [id number] Arrival date & time: 03/17/22  1109     History  Chief Complaint  Patient presents with   Sore Throat    Suzanne Mays is a 31 y.o. female.   Patient as above with significant medical history as below, including wisdom tooth extraction who presents to the ED with complaint of cough, congestion, rhinorrhea, postnasal drip, sore throat.  Symptom onset 4 days ago.  No fevers or chills.  Intermittent cough occasionally with yellow/clear sputum.  She has clear rhinorrhea.  Does endorse sinus pressure frontal.  No nausea or vomiting.  No significant chest pain.  Not requiring supplemental oxygen.  No recent travel or sick contacts.  No medications prior to arrival.  Mild pain with swallowing but is able to tolerate her own secretions.  Tolerating p.o. intake.     History reviewed. No pertinent past medical history.  Past Surgical History: No date: WISDOM TOOTH EXTRACTION    The history is provided by the patient. No language interpreter was used.  Sore Throat Pertinent negatives include no chest pain, no abdominal pain, no headaches and no shortness of breath.      Home Medications Prior to Admission medications   Medication Sig Start Date End Date Taking? Authorizing Provider  acetaminophen (TYLENOL) 325 MG tablet Take 2 tablets (650 mg total) by mouth every 6 (six) hours as needed. 03/17/22  Yes Sloan Leiter, DO  cetirizine (ZYRTEC ALLERGY) 10 MG tablet Take 1 tablet (10 mg total) by mouth daily for 14 days. 03/17/22 03/31/22 Yes Tanda Rockers A, DO  fluticasone (FLONASE) 50 MCG/ACT nasal spray Place 1 spray into both nostrils daily for 7 days. 03/17/22 05/16/22 Yes Sloan Leiter, DO  guaiFENesin (ROBITUSSIN) 100 MG/5ML liquid Take 5-10 mLs (100-200 mg total) by mouth every 4 (four) hours as needed for cough or to loosen phlegm. 03/17/22  Yes Tanda Rockers A, DO  ibuprofen (ADVIL) 600 MG tablet Take 1 tablet  (600 mg total) by mouth every 6 (six) hours as needed. 03/17/22  Yes Sloan Leiter, DO  pseudoephedrine (SUDAFED 12 HOUR) 120 MG 12 hr tablet Take 1 tablet (120 mg total) by mouth 2 (two) times daily for 5 days. 03/17/22 03/22/22 Yes Sloan Leiter, DO  benzonatate (TESSALON) 100 MG capsule Take 1 capsule (100 mg total) by mouth every 8 (eight) hours. 06/18/21   Caccavale, Sophia, PA-C      Allergies    Latex and Zoloft [sertraline hcl]    Review of Systems   Review of Systems  Constitutional:  Negative for chills and fever.  HENT:  Positive for congestion, postnasal drip, rhinorrhea, sinus pressure and sore throat. Negative for facial swelling and trouble swallowing.   Eyes:  Negative for photophobia and visual disturbance.  Respiratory:  Negative for cough and shortness of breath.   Cardiovascular:  Negative for chest pain and palpitations.  Gastrointestinal:  Negative for abdominal pain, nausea and vomiting.  Endocrine: Negative for polydipsia and polyuria.  Genitourinary:  Negative for difficulty urinating and hematuria.  Musculoskeletal:  Negative for gait problem and joint swelling.  Skin:  Negative for pallor and rash.  Neurological:  Negative for syncope and headaches.  Psychiatric/Behavioral:  Negative for agitation and confusion.    Physical Exam Updated Vital Signs BP 106/73   Pulse 90   Temp 98.7 F (37.1 C) (Oral)   Resp 18   Ht  (1.626 m)   Wt 63.5 kg  LMP 03/09/2022   SpO2 99%   BMI 24.03 kg/m  Physical Exam Vitals and nursing note reviewed.  Constitutional:      General: She is not in acute distress.    Appearance: Normal appearance. She is well-developed.  HENT:     Head: Normocephalic and atraumatic.     Jaw: There is normal jaw occlusion. No trismus.     Right Ear: External ear normal.     Left Ear: External ear normal.     Nose: Nose normal.     Mouth/Throat:     Mouth: Mucous membranes are moist.     Tongue: Tongue does not deviate from midline.      Pharynx: Uvula midline. Posterior oropharyngeal erythema present. No oropharyngeal exudate or uvula swelling.  Eyes:     General: No scleral icterus.       Right eye: No discharge.        Left eye: No discharge.  Cardiovascular:     Rate and Rhythm: Normal rate and regular rhythm.     Pulses: Normal pulses.     Heart sounds: Normal heart sounds.  Pulmonary:     Effort: Pulmonary effort is normal. No respiratory distress.     Breath sounds: Normal breath sounds.  Abdominal:     General: Abdomen is flat.     Palpations: Abdomen is soft.     Tenderness: There is no abdominal tenderness.  Musculoskeletal:        General: Normal range of motion.     Cervical back: Normal range of motion.     Right lower leg: No edema.     Left lower leg: No edema.  Skin:    General: Skin is warm and dry.     Capillary Refill: Capillary refill takes less than 2 seconds.  Neurological:     Mental Status: She is alert and oriented to person, place, and time.     GCS: GCS eye subscore is 4. GCS verbal subscore is 5. GCS motor subscore is 6.  Psychiatric:        Mood and Affect: Mood normal.        Behavior: Behavior normal.    ED Results / Procedures / Treatments   Labs (all labs ordered are listed, but only abnormal results are displayed) Labs Reviewed  GROUP A STREP BY PCR    EKG None  Radiology No results found.  Procedures Procedures    Medications Ordered in ED Medications  lidocaine (XYLOCAINE) 2 % viscous mouth solution 15 mL (has no administration in time range)  ketorolac (TORADOL) injection 60 mg (has no administration in time range)    ED Course/ Medical Decision Making/ A&P                           Medical Decision Making Risk OTC drugs. Prescription drug management.    CC: Sore throat, URI  This patient presents to the Emergency Department for the above complaint. This involves an extensive number of treatment options and is a complaint that carries with it  a high risk of complications and morbidity. Vital signs were reviewed. Serious etiologies considered.  DDx includes not limited to strep pharyngitis, viral pharyngitis, peritonsillar abscess, influenza, viral syndrome, allergic rhinitis, sinusitis, other acute etiologies  Record review:  Previous records obtained and reviewed prior office visits, prior labs and imaging  Additional history obtained from N/A  Medical and surgical history as noted above.   Work  up as above, notable for:   Labs & imaging results that were available during my care of the patient were visualized by me and considered in my medical decision making.   Strep swab is negative  She is breathing comfortably on ambient air.  Speaking clearly in full sentences, no drooling, stridor or trismus.  No evidence of deep space infection on oropharynx exam.  Management: Give viscous lidocaine, Toradol injection  Reassessment:  Patient is feeling better after intervention.  Admission was considered.   Symptoms likely secondary to viral syndrome.  Reduce suspicion for bacterial infection.  Supportive care at home.  Strict return precautions.  Work note provided.  The patient improved significantly and was discharged in stable condition. Detailed discussions were had with the patient regarding current findings, and need for close f/u with PCP or on call doctor. The patient has been instructed to return immediately if the symptoms worsen in any way for re-evaluation. Patient verbalized understanding and is in agreement with current care plan. All questions answered prior to discharge.              Social determinants of health include -  Social History   Socioeconomic History   Marital status: Single    Spouse name: Not on file   Number of children: Not on file   Years of education: Not on file   Highest education level: Not on file  Occupational History   Not on file  Tobacco Use   Smoking status: Never    Smokeless tobacco: Never  Vaping Use   Vaping Use: Never used  Substance and Sexual Activity   Alcohol use: Yes    Comment: occ   Drug use: No   Sexual activity: Yes    Birth control/protection: None  Other Topics Concern   Not on file  Social History Narrative   Journalism/media studies- [employer]   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  Despite best efforts to proofread,  errors can occur which can change the documentation meaning.         Final Clinical Impression(s) / ED Diagnoses Final diagnoses:  Pharyngitis, unspecified etiology  Upper respiratory tract infection, unspecified type    Rx / DC Orders ED Discharge Orders          Ordered    fluticasone (FLONASE) 50 MCG/ACT nasal spray  Daily        03/17/22 1332    pseudoephedrine (SUDAFED 12 HOUR) 120 MG 12 hr tablet  2 times daily        03/17/22 1332    guaiFENesin (ROBITUSSIN) 100 MG/5ML liquid  Every 4 hours PRN        03/17/22 1332    acetaminophen (TYLENOL) 325 MG tablet  Every 6 hours PRN        03/17/22 1332    ibuprofen (ADVIL) 600 MG tablet  Every 6 hours PRN  03/17/22 1332    cetirizine (ZYRTEC ALLERGY) 10 MG tablet  Daily        03/17/22 1333              Sloan Leiter, DO 03/17/22 1338

## 2022-03-17 NOTE — ED Notes (Signed)
Since Friday has had a sore throat, having to spit out secretions since it was so hard to swallow, poor PO intake to sore throat, speech appears WNL. Resp even and non-labored. Strep Test has resulted.

## 2022-03-17 NOTE — ED Triage Notes (Signed)
Pt arrives pov, steady gait c/o sore throat and fever x 4 days.

## 2022-03-17 NOTE — ED Notes (Signed)
AVS reviewed with client, discussed sore throat comfort measures, also reviewed each medication given in the ED along with each Rx that was sent electronically to the [facility]. Discussed times when it would indicate for her to return to the ED. Opportunity for questions provided prior to DC to home

## 2022-03-17 NOTE — Discharge Instructions (Addendum)
It was a pleasure caring for you today in the emergency department. ° °Please return to the emergency department for any worsening or worrisome symptoms. ° ° °

## 2022-03-17 NOTE — ED Notes (Signed)
Prior to DC from ED, swallowing and speech reassessed, no difficulty noted, airway intact, GCS 15 noted

## 2022-05-10 ENCOUNTER — Other Ambulatory Visit: Payer: Self-pay

## 2022-05-10 ENCOUNTER — Emergency Department (HOSPITAL_BASED_OUTPATIENT_CLINIC_OR_DEPARTMENT_OTHER)
Admission: EM | Admit: 2022-05-10 | Discharge: 2022-05-10 | Disposition: A | Discharge status: Home / Self Care | Payer: Self-pay | Attending: Emergency Medicine | Admitting: Emergency Medicine

## 2022-05-10 ENCOUNTER — Encounter (HOSPITAL_BASED_OUTPATIENT_CLINIC_OR_DEPARTMENT_OTHER): Payer: Self-pay | Admitting: Emergency Medicine

## 2022-05-10 DIAGNOSIS — T192XXA Foreign body in vulva and vagina, initial encounter: Secondary | ICD-10-CM | POA: Insufficient documentation

## 2022-05-10 DIAGNOSIS — X58XXXA Exposure to other specified factors, initial encounter: Secondary | ICD-10-CM | POA: Insufficient documentation

## 2022-05-10 DIAGNOSIS — I314 Cardiac tamponade: Secondary | ICD-10-CM | POA: Insufficient documentation

## 2022-05-10 NOTE — ED Provider Notes (Signed)
[facility] EMERGENCY DEPARTMENT Provider Note   CSN: [id number] Arrival date & time: 05/10/22  1025     History  Chief Complaint  Patient presents with   Foreign Body in Vagina    Suzanne Mays is a 31 y.o. female.  HPI 31 year old female who presents to the ER with concerns for retained tampon.  She states that she has been on her period and yesterday was busy planning her boyfriend's birthday party and forgot whether or not she had taken a tampon out.  She states that she has been trying to search for the tampon and get it out herself all morning with no relief.  She read about toxic shock syndrome and wanted to be evaluated in the ER to see if the tampon is still in.  She denies any pain, fevers, chills, or any other complaints at this time.    Home Medications Prior to Admission medications   Medication Sig Start Date End Date Taking? Authorizing Provider  acetaminophen (TYLENOL) 325 MG tablet Take 2 tablets (650 mg total) by mouth every 6 (six) hours as needed. 03/17/22   Sloan Leiter, DO  benzonatate (TESSALON) 100 MG capsule Take 1 capsule (100 mg total) by mouth every 8 (eight) hours. 06/18/21   Caccavale, Sophia, PA-C  cetirizine (ZYRTEC ALLERGY) 10 MG tablet Take 1 tablet (10 mg total) by mouth daily for 14 days. 03/17/22 06/25/22  Sloan Leiter, DO  fluticasone (FLONASE) 50 MCG/ACT nasal spray Place 1 spray into both nostrils daily for 7 days. 03/17/22 05/16/22  Sloan Leiter, DO  guaiFENesin (ROBITUSSIN) 100 MG/5ML liquid Take 5-10 mLs (100-200 mg total) by mouth every 4 (four) hours as needed for cough or to loosen phlegm. 03/17/22   Sloan Leiter, DO  ibuprofen (ADVIL) 600 MG tablet Take 1 tablet (600 mg total) by mouth every 6 (six) hours as needed. 03/17/22   Sloan Leiter, DO      Allergies    Latex and Zoloft [sertraline hcl]    Review of Systems   Review of Systems Ten systems reviewed and are negative for acute change, except as noted in the HPI.    Physical Exam Updated Vital Signs BP 112/82 (BP Location: Right Arm)   Pulse 95   Temp 98.3 F (36.8 C) (Oral)   Resp 18   LMP 05/06/2022 (Exact Date)   SpO2 95%  Physical Exam Vitals reviewed.  Constitutional:      Appearance: Normal appearance.  HENT:     Head: Normocephalic and atraumatic.  Eyes:     General:        Right eye: No discharge.        Left eye: No discharge.     Extraocular Movements: Extraocular movements intact.     Conjunctiva/sclera: Conjunctivae normal.  Genitourinary:    Comments: pelvic exam performed with RN at bedside.  Normal external exam.  Scant blood at the cervical os, no evidence of foreign bodies. Musculoskeletal:        General: No swelling. Normal range of motion.  Neurological:     General: No focal deficit present.     Mental Status: She is alert and oriented to person, place, and time.  Psychiatric:        Mood and Affect: Mood normal.        Behavior: Behavior normal.     ED Results / Procedures / Treatments   Labs (all labs ordered are listed, but only abnormal results are displayed)  Labs Reviewed - No data to display  EKG None  Radiology No results found.  Procedures Procedures    Medications Ordered in ED Medications - No data to display  ED Course/ Medical Decision Making/ A&P                           Medical Decision Making Patient presenting with concerns for retained tampon.  No evidence of tampon on physical exam.  Patient has no other complaints, low suspicion for STI/PID, infection.  Patient reassured.  Stable for discharge. Final Clinical Impression(s) / ED Diagnoses Final diagnoses:  Retained tampon not found on examination    Rx / DC Orders ED Discharge Orders     None         Mare Ferrari, PA-C 05/10/22 1048    Melene Plan, DO 05/10/22 1127

## 2022-05-10 NOTE — ED Triage Notes (Signed)
Patient is unsure if she took her tampon out yesterday .

## 2022-09-17 ENCOUNTER — Ambulatory Visit: Payer: BC Managed Care – PPO | Admitting: Nurse Practitioner

## 2022-09-17 ENCOUNTER — Encounter: Payer: Self-pay | Admitting: Nurse Practitioner

## 2022-09-17 ENCOUNTER — Telehealth: Payer: Self-pay | Admitting: Genetic Counselor

## 2022-09-17 VITALS — BP 98/60 | HR 75 | Temp 97.6°F | Ht 64.0 in | Wt 147.8 lb

## 2022-09-17 DIAGNOSIS — Z8041 Family history of malignant neoplasm of ovary: Secondary | ICD-10-CM | POA: Diagnosis not present

## 2022-09-17 DIAGNOSIS — Z803 Family history of malignant neoplasm of breast: Secondary | ICD-10-CM | POA: Diagnosis not present

## 2022-09-17 DIAGNOSIS — G43009 Migraine without aura, not intractable, without status migrainosus: Secondary | ICD-10-CM | POA: Insufficient documentation

## 2022-09-17 MED ORDER — EXCEDRIN MIGRAINE 250-250-65 MG PO TABS: 1.0000 | ORAL_TABLET | Freq: Three times a day (TID) | ORAL | 1 refills | Status: AC | PRN

## 2022-09-17 NOTE — Patient Instructions (Signed)
Keep migraine log to help identify triggers. You will be contacted to schedule appt with genetic counselor. Thank you for choosing [facility]  Migraine Headache A migraine headache is a very strong throbbing pain on one side or both sides of your head. This type of headache can also cause other symptoms. It can last from 4 hours to 3 days. Talk with your doctor about what things may bring on (trigger) this condition. What are the causes? The exact cause of this condition is not known. This condition may be triggered or caused by: Drinking alcohol. Smoking. Taking medicines, such as: Medicine used to treat chest pain (nitroglycerin). Birth control pills. Estrogen. Some blood pressure medicines. Eating or drinking certain products. Doing physical activity. Other things that may trigger a migraine headache include: Having a menstrual period. Pregnancy. Hunger. Stress. Not getting enough sleep or getting too much sleep. Weather changes. Tiredness (fatigue). What increases the risk? Being 17-17 years old. Being female. Having a family history of migraine headaches. Being Caucasian. Having depression or anxiety. Being very overweight. What are the signs or symptoms? A throbbing pain. This pain may: Happen in any area of the head, such as on one side or both sides. Make it hard to do daily activities. Get worse with physical activity. Get worse around bright lights or loud noises. Other symptoms may include: Feeling sick to your stomach (nauseous). Vomiting. Dizziness. Being sensitive to bright lights, loud noises, or smells. Before you get a migraine headache, you may get warning signs (an aura). An aura may include: Seeing flashing lights or having blind spots. Seeing bright spots, halos, or zigzag lines. Having tunnel vision or blurred vision. Having numbness or a tingling feeling. Having trouble talking. Having weak muscles. Some people have symptoms after a  migraine headache (postdromal phase), such as: Tiredness. Trouble thinking (concentrating). How is this treated? Taking medicines that: Relieve pain. Relieve the feeling of being sick to your stomach. Prevent migraine headaches. Treatment may also include: Having acupuncture. Avoiding foods that bring on migraine headaches. Learning ways to control your body functions (biofeedback). Therapy to help you know and deal with negative thoughts (cognitive behavioral therapy). Follow these instructions at home: Medicines Take over-the-counter and prescription medicines only as told by your doctor. Ask your doctor if the medicine prescribed to you: Requires you to avoid driving or using heavy machinery. Can cause trouble pooping (constipation). You may need to take these steps to prevent or treat trouble pooping: Drink enough fluid to keep your pee (urine) pale yellow. Take over-the-counter or prescription medicines. Eat foods that are high in fiber. These include beans, whole grains, and fresh fruits and vegetables. Limit foods that are high in fat and sugar. These include fried or sweet foods. Lifestyle Do not drink alcohol. Do not use any products that contain nicotine or tobacco, such as cigarettes, e-cigarettes, and chewing tobacco. If you need help quitting, ask your doctor. Get at least 8 hours of sleep every night. Limit and deal with stress. General instructions Keep a journal to find out what may bring on your migraine headaches. For example, write down: What you eat and drink. How much sleep you get. Any change in what you eat or drink. Any change in your medicines. If you have a migraine headache: Avoid things that make your symptoms worse, such as bright lights. It may help to lie down in a dark, quiet room. Do not drive or use heavy machinery. Ask your doctor what activities are safe for  you. Keep all follow-up visits as told by your doctor. This is important. Contact a  doctor if: You get a migraine headache that is different or worse than others you have had. You have more than 15 headache days in one month. Get help right away if: Your migraine headache gets very bad. Your migraine headache lasts longer than 72 hours. You have a fever. You have a stiff neck. You have trouble seeing. Your muscles feel weak or like you cannot control them. You start to lose your balance a lot. You start to have trouble walking. You pass out (faint). You have a seizure. Summary A migraine headache is a very strong throbbing pain on one side or both sides of your head. These headaches can also cause other symptoms. This condition may be treated with medicines and changes to your lifestyle. Keep a journal to find out what may bring on your migraine headaches. Contact a doctor if you get a migraine headache that is different or worse than others you have had. Contact your doctor if you have more than 15 headache days in a month. This information is not intended to replace advice given to you by your health care provider. Make sure you discuss any questions you have with your health care provider. Document Revised: 03/19/2022 Document Reviewed: 11/17/2018 Elsevier Patient Education  2023 [employer].

## 2022-09-17 NOTE — Progress Notes (Signed)
Established Patient Visit  Patient: Suzanne Mays   DOB: 03/31/1991   31 y.o. Female  MRN: [id number] Visit Date: 09/17/2022  Subjective:    Chief Complaint  Patient presents with   Establish Care    New Pt, Est Care  C/o headaches x 6 months on & off  Breast cancer screening due to family Hx Requesting records for pap    HPI Transfer from [facility] Craze, NP with [facility]: Dr. Lowell Guitar Last PAP 11/2021: normal per patient.  Migraine without aura and without status migrainosus, not intractable Chronic, onset 67yrs ago Onset with use of COC, but now discontinued x 1year Headache improved with discontinuation of COC, but have not resolved. Frequency is  3-4times a month, intense pressure and throbbing on right side of head, associated with light and noise sensitivity. No nausea or vomiting, no aura, no change in vision, no syncope. Tried advil 500mg  and tylenol 650mg  with no relief. Headache resolves with sleep. No Fhx of migraine or HA No hx of head injury. She works from home on a computer. Uptodate with eye exam, use of corrective lens.  Normal neuro exam today. Advised to try excedrin migraine OTC Keep a migraine diary, minimize ETOH, caffeine and maintain adeqaute rest and oral hydration. F/up in 72month  Reviewed medical, surgical, and social history today Family History  Problem Relation Age of Onset   Hypertension Mother    Diabetes Mother    Stroke Maternal Grandmother    Cancer Maternal Grandmother 26       breast & brain   Cancer Other 62       breast cancer   Cancer Other 33       Ovarian cancer    Social History   Socioeconomic History   Marital status: Single    Spouse name: Not on file   Number of children: Not on file   Years of education: Not on file   Highest education level: Not on file  Occupational History   Not on file  Tobacco Use   Smoking status: Never   Smokeless tobacco: Never  Vaping Use   Vaping Use: Never  used  Substance and Sexual Activity   Alcohol use: Yes    Comment: occ   Drug use: No   Sexual activity: Yes    Birth control/protection: None  Other Topics Concern   Not on file  Social History Narrative   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    Works for [employer] in [place], [employer]   Social Determinants of [place] Strain: Not on [employer] Insecurity: Not on file  Transportation Needs: Not on file  Physical Activity: Not on file  Stress: Not on file  Social Connections: Not on file  Intimate Partner Violence: Not on file     Medications: Outpatient Medications Prior to Visit  Medication Sig   cetirizine (ZYRTEC ALLERGY) 10 MG tablet Take 1 tablet (10 mg total) by mouth daily for 14 days.   fluticasone (FLONASE) 50 MCG/ACT nasal spray Place 1 spray into both nostrils daily for 7 days.   [DISCONTINUED] acetaminophen (TYLENOL) 325 MG tablet Take 2 tablets (650 mg total) by mouth every 6 (six) hours as needed. (Patient not taking: Reported on 09/17/2022)   [DISCONTINUED] benzonatate (TESSALON) 100 MG capsule Take 1 capsule (100  mg total) by mouth every 8 (eight) hours. (Patient not taking: Reported on 09/17/2022)   [DISCONTINUED] guaiFENesin (ROBITUSSIN) 100 MG/5ML liquid Take 5-10 mLs (100-200 mg total) by mouth every 4 (four) hours as needed for cough or to loosen phlegm. (Patient not taking: Reported on 09/17/2022)   [DISCONTINUED] ibuprofen (ADVIL) 600 MG tablet Take 1 tablet (600 mg total) by mouth every 6 (six) hours as needed. (Patient not taking: Reported on 09/17/2022)   No facility-administered medications prior to visit.   Reviewed past medical and social history.   ROS per HPI above      Objective:  BP 98/60 (BP Location: Right Arm, Patient Position: Sitting, Cuff Size: Small)   Pulse 75   Temp 97.6 F (36.4 C) (Temporal)   Ht 5\' 4"  (1.626 m)   Wt 147 lb 12.8 oz (67  kg)   LMP 08/23/2022 (Exact Date)   SpO2 95%   BMI 25.37 kg/m      Physical Exam Constitutional:      General: She is not in acute distress. Eyes:     Extraocular Movements: Extraocular movements intact.     Conjunctiva/sclera: Conjunctivae normal.     Pupils: Pupils are equal, round, and reactive to light.  Cardiovascular:     Rate and Rhythm: Normal rate and regular rhythm.     Pulses: Normal pulses.     Heart sounds: Normal heart sounds.  Pulmonary:     Effort: Pulmonary effort is normal.     Breath sounds: Normal breath sounds.  Musculoskeletal:     Cervical back: Normal range of motion and neck supple.  Lymphadenopathy:     Cervical: No cervical adenopathy.  Skin:    General: Skin is warm and dry.  Neurological:     Mental Status: She is alert and oriented to person, place, and time.     Cranial Nerves: No cranial nerve deficit.     Gait: Gait normal.  Psychiatric:        Mood and Affect: Mood normal.        Behavior: Behavior normal.        Thought Content: Thought content normal.     No results found for any visits on 09/17/22.    Assessment & Plan:    Problem List Items Addressed This Visit       Cardiovascular and Mediastinum   Migraine without aura and without status migrainosus, not intractable - Primary    Chronic, onset 74yrs ago Onset with use of COC, but now discontinued x 1year Headache improved with discontinuation of COC, but have not resolved. Frequency is  3-4times a month, intense pressure and throbbing on right side of head, associated with light and noise sensitivity. No nausea or vomiting, no aura, no change in vision, no syncope. Tried advil 500mg  and tylenol 650mg  with no relief. Headache resolves with sleep. No Fhx of migraine or HA No hx of head injury. She works from home on a computer. Uptodate with eye exam, use of corrective lens.  Normal neuro exam today. Advised to try excedrin migraine OTC Keep a migraine diary, minimize  ETOH, caffeine and maintain adeqaute rest and oral hydration. F/up in 52month      Relevant Medications   aspirin-acetaminophen-caffeine (EXCEDRIN MIGRAINE) 250-250-65 MG tablet     Other   Family history of breast cancer   Relevant Orders   Ambulatory referral to Genetics   Family history of ovarian cancer   Relevant Orders   Ambulatory referral to [facility]  Return in about 5 weeks (around 10/22/2022) for CPE (fasting).     Alysia Penna, NP

## 2022-09-17 NOTE — Assessment & Plan Note (Addendum)
Chronic, onset 68yrs ago Onset with use of COC, but now discontinued x 1year Headache improved with discontinuation of COC, but have not resolved. Frequency is  3-4times a month, intense pressure and throbbing on right side of head, associated with light and noise sensitivity. No nausea or vomiting, no aura, no change in vision, no syncope. Tried advil 500mg  and tylenol 650mg  with no relief. Headache resolves with sleep. No Fhx of migraine or HA No hx of head injury. She works from home on a computer. Uptodate with eye exam, use of corrective lens.  Normal neuro exam today. Advised to try excedrin migraine OTC Keep a migraine diary, minimize ETOH, caffeine and maintain adeqaute rest and oral hydration. F/up in 28month

## 2022-09-17 NOTE — Telephone Encounter (Signed)
Called patient to notify of upcoming appointment. No voicemail available. Mailing reminder

## 2022-10-26 ENCOUNTER — Inpatient Hospital Stay: Payer: BC Managed Care – PPO

## 2022-10-26 ENCOUNTER — Inpatient Hospital Stay: Payer: BC Managed Care – PPO | Attending: Nurse Practitioner | Admitting: Genetic Counselor

## 2022-10-26 DIAGNOSIS — Z803 Family history of malignant neoplasm of breast: Secondary | ICD-10-CM

## 2022-10-26 DIAGNOSIS — Z8041 Family history of malignant neoplasm of ovary: Secondary | ICD-10-CM | POA: Diagnosis not present

## 2022-10-26 NOTE — Progress Notes (Signed)
REFERRING PROVIDER: Flossie Buffy, NP [place],  [place] [address]  PRIMARY PROVIDER:  Nche, Suzanne Brooke, NP  PRIMARY REASON FOR VISIT:  No diagnosis found.   HISTORY OF PRESENT ILLNESS:   Suzanne Mays, a 32 y.o. female, was seen for a [place] cancer genetics consultation at the request of Dr. Lorayne Mays due to a family history of breast and ovarian cancer.  Suzanne Mays presents to clinic today to discuss the possibility of a hereditary predisposition to cancer, to discuss genetic testing, and to further clarify her future cancer risks, as well as potential cancer risks for family members.     *** Suzanne Mays is a 32 y.o. female with no personal history of cancer.     RISK FACTORS:  Mammogram within the last year: no Number of breast biopsies: 0. Colonoscopy: no; not examined. Hysterectomy: no.  Ovaries intact: yes.  Menarche was at age 50.  Nulliparous.  Menopausal status: premenopausal.  OCP use for approximately  ~10  years.  Dermatology screening: no  Past Medical History:  Diagnosis Date   Abnormal Pap smear of vagina 10/05/2012   Costochondritis 08/07/2014   Musculoskeletal pain 01/16/2013   Palpitations 08/07/2014   Sleep deficient 08/07/2014    Past Surgical History:  Procedure Laterality Date   WISDOM TOOTH EXTRACTION        FAMILY HISTORY:  We obtained a detailed, 4-generation family history.  Significant diagnoses are listed below: Family History  Problem Relation Age of Onset   Hypertension Mother    Diabetes Mother    Stroke Maternal Grandmother    Cancer Maternal Grandmother 27       breast & brain   Cancer Other 72       breast cancer   Cancer Other 67       Ovarian cancer    Ms. Schriever is {aware/unaware} of previous family history of genetic testing for hereditary cancer risks. Patient's maternal ancestors are of *** descent, and paternal ancestors are of *** descent. There {IS NO:12509} reported Ashkenazi Jewish  ancestry. There {IS NO:12509} known consanguinity.  GENETIC COUNSELING ASSESSMENT: Suzanne Mays is a 32 y.o. female with a {Personal/family:20331} history of {cancer/polyps} which is somewhat suggestive of a {DISEASE} and predisposition to cancer given ***. We, therefore, discussed and recommended the following at today's visit.   DISCUSSION: We discussed that 5 - 10% of cancer is hereditary, with most cases of hereditary breast and ovarian cancer associated with mutations in BRCA1/2.  There are other genes that can be associated with hereditary breast or ovarian cancer syndromes.  We discussed that testing is beneficial for several reasons, including knowing about other cancer risks, identifying potential screening and risk-reduction options that may be appropriate, and to understanding if other family members could be at risk for cancer and allowing them to undergo genetic testing.  We reviewed the characteristics, features and inheritance patterns of hereditary cancer syndromes. We also discussed genetic testing, including the appropriate family members to test, the process of testing, insurance coverage and turn-around-time for results. We discussed the implications of a negative, positive, and variant of uncertain significant result. We discussed that negative results would be uninformative given that Suzanne Mays does not have a personal history of cancer. We recommended Suzanne Mays pursue genetic testing for a panel that contains genes associated with ***.  Suzanne Mays was offered a common hereditary cancer panel (48 genes) and an expanded pan-cancer panel (85 genes). Suzanne Mays was informed of the benefits  and limitations of each panel, including that expanded pan-cancer panels contain several genes that do not have clear management guidelines at this point in time.  We also discussed that as the number of genes included on a panel increases, the chances of variants of uncertain significance increases.   After considering the benefits and limitations of each gene panel, Suzanne Mays elected to have an *** through ***.   Based on Suzanne Mays's {Personal/family:20331} history of cancer, she meets medical criteria for genetic testing. Despite that she meets criteria, she may still have an out of pocket cost. We discussed that if her out of pocket cost for testing is over $100, the laboratory should contact them to discuss self-pay options and/or patient pay assistance programs.   ***We reviewed the characteristics, features and inheritance patterns of hereditary cancer syndromes. We also discussed genetic testing, including the appropriate family members to test, the process of testing, insurance coverage and turn-around-time for results. We discussed the implications of a negative, positive and/or variant of uncertain significant result. In order to get genetic test results in a timely manner so that Suzanne Mays can use these genetic test results for surgical decisions, we recommended Suzanne Mays pursue genetic testing for the ***. Once complete, we recommend Suzanne Mays pursue reflex genetic testing to the *** gene panel.   Based on Suzanne Mays's {Personal/family:20331} history of cancer, she meets medical criteria for genetic testing. Despite that she meets criteria, she may still have an out of pocket cost.   ***We discussed with Suzanne Mays that the {Personal/family:20331} history does not meet insurance or NCCN criteria for genetic testing and, therefore, is not highly consistent with a familial hereditary cancer syndrome.  We feel she is at low risk to harbor a gene mutation associated with such a condition. Thus, we did not recommend any genetic testing, at this time, and recommended Suzanne Mays continue to follow the cancer screening guidelines given by her primary healthcare provider.  ***In order to estimate her chance of having a {CA GENE:62345} mutation, we used statistical models ({GENMODELS:62370}) that  consider her personal medical history, family history and ancestry.  Because each model is different, there can be a lot of variability in the risks they give.  Therefore, these numbers must be considered a rough range and not a precise risk of having a {CA GENE:62345} mutation.  These models estimate that she has approximately a ***-***% chance of having a mutation. Based on this assessment of her family and personal history, genetic testing {IS/ISNOT:34056} recommended.  ***Based on the patient's {Personal/family:20331} history, a statistical model ({GENMODELS:62370}) was used to estimate her risk of developing {CA HX:54794}. This estimates her lifetime risk of developing {CA HX:54794} to be approximately ***%. This estimation does not consider any genetic testing results.  The patient's lifetime breast cancer risk is a preliminary estimate based on available information using one of several models endorsed by the American Cancer Society (ACS). The ACS recommends consideration of breast MRI screening as an adjunct to mammography for patients at high risk (defined as 20% or greater lifetime risk).   ***Ms. Wyffels has been determined to be at high risk for breast cancer.  Therefore, we recommend that annual screening with mammography and breast MRI be performed.  ***begin at age 51, or 10 years prior to the age of breast cancer diagnosis in a relative (whichever is earlier).  We discussed that Ms. Sunga should discuss her individual situation with her referring physician and determine a breast cancer  screening plan with which they are both comfortable.    We discussed the Genetic Information Non-Discrimination Act (GINA) of 2008, which helps protect individuals against genetic discrimination based on their genetic test results.  It impacts both health insurance and employment.  With health insurance, it protects against genetic test results being used for increased premiums or policy termination. For  employment, it protects against hiring, firing and promoting decisions based on genetic test results.  GINA does not apply to those in the [employer], those who work for companies with less than 15 employees, and new life insurance or long-term disability insurance policies.  Health status due to a cancer diagnosis is not protected under GINA.  PLAN: After considering the risks, benefits, and limitations, Ms. Presutti provided informed consent to pursue genetic testing and the blood sample was sent to {Lab} Laboratories for analysis of the {test}. Results should be available within approximately {TAT TIME} weeks' time, at which point they will be disclosed by telephone to Ms. Malan, as will any additional recommendations warranted by these results. Ms. Jastrzebski will receive a summary of her genetic counseling visit and a copy of her results once available. This information will also be available in Epic.   *** Despite our recommendation, Ms. Vanburen did not wish to pursue genetic testing at today's visit. We understand this decision and remain available to coordinate genetic testing at any time in the future. We, therefore, recommend Ms. Mergen continue to follow the cancer screening guidelines given by her primary healthcare provider.  ***Based on Ms. Posten's family history, we recommended her ***, who was diagnosed with *** at age ***, have genetic counseling and testing. Ms. Herard will let us know if we can be of any assistance in coordinating genetic counseling and/or testing for this family member.   Lastly, we encouraged Ms. Kistler to remain in contact with cancer genetics annually so that we can continuously update the family history and inform her of any changes in cancer genetics and testing that may be of benefit for this family.   Ms. Canaday questions were answered to her satisfaction today. Our contact information was provided should additional questions or concerns arise. ***Thank you for the  referral and allowing [place] to share in the care of your patient.   Wyonia Fontanella M. Rennie Plowman, MS, [facility] Genetic Counselor [email or web address] (P) [phone]   The patient was seen for a total of *** minutes in face-to-face genetic counseling.  ***The was patient was accompanied by ***.  ***The patient was seen alone.  Drs. Pamelia Hoit and/or Mosetta Putt were available to discuss this case as needed.  _______________________________________________________________________ For Office Staff:  Number of people involved in session: *** Was an Intern/ student involved with case: {YES/NO:63}

## 2022-10-28 ENCOUNTER — Encounter: Payer: Self-pay | Admitting: Genetic Counselor

## 2022-11-01 ENCOUNTER — Other Ambulatory Visit: Payer: Self-pay | Admitting: Nurse Practitioner

## 2022-11-01 DIAGNOSIS — Z3169 Encounter for other general counseling and advice on procreation: Secondary | ICD-10-CM

## 2022-12-08 ENCOUNTER — Ambulatory Visit (INDEPENDENT_AMBULATORY_CARE_PROVIDER_SITE_OTHER): Payer: BC Managed Care – PPO | Admitting: Nurse Practitioner

## 2022-12-08 ENCOUNTER — Encounter: Payer: Self-pay | Admitting: Nurse Practitioner

## 2022-12-08 VITALS — BP 104/66 | HR 78 | Temp 98.3°F | Ht 64.0 in | Wt 145.6 lb

## 2022-12-08 DIAGNOSIS — Z13220 Encounter for screening for lipoid disorders: Secondary | ICD-10-CM

## 2022-12-08 DIAGNOSIS — Z136 Encounter for screening for cardiovascular disorders: Secondary | ICD-10-CM

## 2022-12-08 DIAGNOSIS — Z0001 Encounter for general adult medical examination with abnormal findings: Secondary | ICD-10-CM | POA: Diagnosis not present

## 2022-12-08 DIAGNOSIS — E559 Vitamin D deficiency, unspecified: Secondary | ICD-10-CM | POA: Diagnosis not present

## 2022-12-08 DIAGNOSIS — N912 Amenorrhea, unspecified: Secondary | ICD-10-CM | POA: Diagnosis not present

## 2022-12-08 LAB — COMPREHENSIVE METABOLIC PANEL
ALT: 9 U/L (ref 0–35)
AST: 15 U/L (ref 0–37)
Albumin: 4.4 g/dL (ref 3.5–5.2)
Alkaline Phosphatase: 35 U/L — ABNORMAL LOW (ref 39–117)
BUN: 18 mg/dL (ref 6–23)
CO2: 25 mEq/L (ref 19–32)
Calcium: 9.3 mg/dL (ref 8.4–10.5)
Chloride: 101 mEq/L (ref 96–112)
Creatinine, Ser: 0.77 mg/dL (ref 0.40–1.20)
GFR: 103.18 mL/min (ref >60.00)
Glucose, Bld: 89 mg/dL (ref 70–99)
Potassium: 4.2 mEq/L (ref 3.5–5.1)
Sodium: 136 mEq/L (ref 135–145)
Total Bilirubin: 0.4 mg/dL (ref 0.2–1.2)
Total Protein: 7.8 g/dL (ref 6.0–8.3)

## 2022-12-08 LAB — CBC
HCT: 40.3 % (ref 36.0–46.0)
Hemoglobin: 13.3 g/dL (ref 12.0–15.0)
MCHC: 32.9 g/dL (ref 30.0–36.0)
MCV: 92.8 fl (ref 78.0–100.0)
Platelets: 310 10*3/uL (ref 150.0–400.0)
RBC: 4.34 Mil/uL (ref 3.87–5.11)
RDW: 13.4 % (ref 11.5–15.5)
WBC: 6.8 10*3/uL (ref 4.0–10.5)

## 2022-12-08 LAB — LIPID PANEL
Cholesterol: 256 mg/dL — ABNORMAL HIGH (ref 0–200)
HDL: 62.6 mg/dL (ref >39.00)
LDL Cholesterol: 180 mg/dL — ABNORMAL HIGH (ref 0–99)
NonHDL: 193.21
Total CHOL/HDL Ratio: 4
Triglycerides: 67 mg/dL (ref 0.0–149.0)
VLDL: 13.4 mg/dL (ref 0.0–40.0)

## 2022-12-08 LAB — VITAMIN D 25 HYDROXY (VIT D DEFICIENCY, FRACTURES): VITD: 12.53 ng/mL — ABNORMAL LOW (ref 30.00–100.00)

## 2022-12-08 LAB — POCT URINE PREGNANCY: Preg Test, Ur: NEGATIVE

## 2022-12-08 NOTE — Patient Instructions (Signed)
Go to lab Continue Heart healthy diet and daily exercise. Please sign medical release to get records from Suzanne Mays 76-32 Years Old, Female Preventive care refers to lifestyle choices and visits with your health care provider that can promote health and wellness. Preventive care visits are also called wellness exams. What can I expect for my preventive care visit? Counseling During your preventive care visit, your health care provider may ask about your: Medical history, including: Past medical problems. Family medical history. Pregnancy history. Current health, including: Menstrual cycle. Method of birth control. Emotional well-being. Home life and relationship well-being. Sexual activity and sexual health. Lifestyle, including: Alcohol, nicotine or tobacco, and drug use. Access to firearms. Diet, exercise, and sleep habits. Work and work Statistician. Sunscreen use. Safety issues such as seatbelt and bike helmet use. Physical exam Your health care provider may check your: Height and weight. These may be used to calculate your BMI (body mass index). BMI is a measurement that tells if you are at a healthy weight. Waist circumference. This measures the distance around your waistline. This measurement also tells if you are at a healthy weight and may help predict your risk of certain diseases, such as type 2 diabetes and high blood pressure. Heart rate and blood pressure. Body temperature. Skin for abnormal spots. What immunizations do I need?  Vaccines are usually given at various ages, according to a schedule. Your health care provider will recommend vaccines for you based on your age, medical history, and lifestyle or other factors, such as travel or where you work. What tests do I need? Screening Your health care provider may recommend screening tests for certain conditions. This may include: Pelvic exam and Pap test. Lipid and cholesterol levels. Diabetes  screening. This is done by checking your blood sugar (glucose) after you have not eaten for a while (fasting). Hepatitis B test. Hepatitis C test. HIV (human immunodeficiency virus) test. STI (sexually transmitted infection) testing, if you are at risk. BRCA-related cancer screening. This may be done if you have a family history of breast, ovarian, tubal, or peritoneal cancers. Talk with your health care provider about your test results, treatment options, and if necessary, the need for more tests. Follow these instructions at home: Eating and drinking  Eat a healthy diet that includes fresh fruits and vegetables, whole grains, lean protein, and low-fat dairy products. Take vitamin and mineral supplements as recommended by your health care provider. Do not drink alcohol if: Your health care provider tells you not to drink. You are pregnant, may be pregnant, or are planning to become pregnant. If you drink alcohol: Limit how much you have to 0-1 drink a day. Know how much alcohol is in your drink. In the [place] one drink equals one 12 oz bottle of beer (355 mL), one 5 oz glass of wine (148 mL), or one 1 oz glass of hard liquor (44 mL). Lifestyle Brush your teeth every morning and night with fluoride toothpaste. Floss one time each day. Exercise for at least 30 minutes 5 or more days each week. Do not use any products that contain nicotine or tobacco. These products include cigarettes, chewing tobacco, and vaping devices, such as e-cigarettes. If you need help quitting, ask your health care provider. Do not use drugs. If you are sexually active, practice safe sex. Use a condom or other form of protection to prevent STIs. If you do not wish to become pregnant, use a form of birth control. If you plan to  become pregnant, see your health care provider for a prepregnancy visit. Find healthy ways to manage stress, such as: Meditation, yoga, or listening to music. Journaling. Talking to a trusted  person. Spending time with friends and family. Minimize exposure to UV radiation to reduce your risk of skin cancer. Safety Always wear your seat belt while driving or riding in a vehicle. Do not drive: If you have been drinking alcohol. Do not ride with someone who has been drinking. If you have been using any mind-altering substances or drugs. While texting. When you are tired or distracted. Wear a helmet and other protective equipment during sports activities. If you have firearms in your house, make sure you follow all gun safety procedures. Seek help if you have been physically or sexually abused. What's next? Go to your health care provider once a year for an annual wellness visit. Ask your health care provider how often you should have your eyes and teeth checked. Stay up to date on all vaccines. This information is not intended to replace advice given to you by your health care provider. Make sure you discuss any questions you have with your health care provider. Document Revised: 04/02/2021 Document Reviewed: 04/02/2021 Elsevier Patient Education  [place].

## 2022-12-08 NOTE — Progress Notes (Unsigned)
Complete physical exam  Patient: Suzanne Mays   DOB: [birth year]   32 y.o. Female  MRN: [id number] Visit Date: 12/08/2022  Subjective:    Chief Complaint  Patient presents with   Annual Exam    CPE, states that period is 1 week late. Patient fasting.    Suzanne Mays is a 32 y.o. female who presents today for a complete physical exam. She reports consuming a general diet.  Walking daily with dog 10-47mns  She generally feels well. She reports sleeping well. She does have additional problems to discuss today.  Vision:Yes Dental:Yes STD Screen:No  BP Readings from Last 3 Encounters:  12/08/22 104/66  09/17/22 98/60  05/10/22 112/82   Wt Readings from Last 3 Encounters:  12/08/22 145 lb 9.6 oz (66 kg)  09/17/22 147 lb 12.8 oz (67 kg)  03/17/22 140 lb (63.5 kg)   Most recent fall risk assessment:    12/08/2022    8:52 AM  Fall Risk   Falls in the past year? 0  Number falls in past yr: 0  Injury with Fall? 0  Risk for fall due to : No Fall Risks  Follow up Falls evaluation completed   Depression screen:Yes - No Depression  Most recent depression screenings:    12/08/2022    9:23 AM 12/08/2022    8:52 AM  PHQ 2/9 Scores  PHQ - 2 Score 1 0  PHQ- 9 Score 5     HPI  No problem-specific Assessment & Plan notes found for this encounter.   Past Medical History:  Diagnosis Date   Abnormal Pap smear of vagina 10/05/2012   Costochondritis 08/07/2014   Musculoskeletal pain 01/16/2013   Palpitations 08/07/2014   Sleep deficient 08/07/2014   Past Surgical History:  Procedure Laterality Date   WISDOM TOOTH EXTRACTION     Social History   Socioeconomic History   Marital status: Single    Spouse name: Not on file   Number of children: Not on file   Years of education: Not on file   Highest education level: Not on file  Occupational History   Not on file  Tobacco Use   Smoking status: Never   Smokeless tobacco: Never  Vaping Use   Vaping Use: Never used   Substance and Sexual Activity   Alcohol use: Yes    Comment: occ   Drug use: No   Sexual activity: Yes    Birth control/protection: None  Other Topics Concern   Not on file  Social History Narrative   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    [place] Not on fComcastInsecurity: Not on file  Transportation Needs: Not on file  Physical Activity: Not on file  Stress: Not on file  Social Connections: Not on file  Intimate Partner Violence: Not on file   Family Status  Relation Name Status   Mother  Alive   Father  Deceased   MGM  Deceased   Other  (Not Specified)   Other  (Not Specified)   Family History  Problem Relation Age of Onset   Hypertension Mother    Diabetes Mother    Stroke Maternal Grandmother    Breast cancer Maternal Grandmother 60       mets   Breast cancer Other  MGM's sisters x3 and MGM's mother; dx after 80   Ovarian cancer Other        MGM's sister   Kidney cancer Other        MGM's sister   Allergies  Allergen Reactions   Latex    Zoloft [Sertraline Hcl]     Somnolence    Patient Care Team: Ersa Delaney, Charlene Brooke, NP as PCP - General (Internal Medicine) Ob/Gyn, [facility] (Obstetrics and Gynecology)   Medications: Outpatient Medications Prior to Visit  Medication Sig   aspirin-acetaminophen-caffeine (EXCEDRIN MIGRAINE) 250-250-65 MG tablet Take 1 tablet by mouth every 8 (eight) hours as needed for headache.   Boric Acid Vaginal 600 MG SUPP place 1 capsule in vagina twice a week AS NEEDED   [DISCONTINUED] cetirizine (ZYRTEC ALLERGY) 10 MG tablet Take 1 tablet (10 mg total) by mouth daily for 14 days.   [DISCONTINUED] fluticasone (FLONASE) 50 MCG/ACT nasal spray Place 1 spray into both nostrils daily for 7 days.   No facility-administered medications prior to visit.     Review of Systems  Constitutional:  Negative for activity change, appetite change and unexpected weight change.  Respiratory: Negative.    Cardiovascular: Negative.   Gastrointestinal: Negative.   Endocrine: Negative for cold intolerance and heat intolerance.  Genitourinary: Negative.   Musculoskeletal: Negative.   Skin: Negative.   Neurological: Negative.   Hematological: Negative.   Psychiatric/Behavioral:  Negative for behavioral problems, decreased concentration, dysphoric mood, hallucinations, self-injury, sleep disturbance and suicidal ideas. The patient is not nervous/anxious.     {Show previous labs (optional):23779}    Objective:  BP 104/66 (BP Location: Right Arm, Patient Position: Sitting, Cuff Size: Normal)   Pulse 78   Temp 98.3 F (36.8 C) (Temporal)   Ht 5' 4"$  (1.626 m)   Wt 145 lb 9.6 oz (66 kg)   LMP 10/31/2022 (Exact Date)   SpO2 98%   BMI 24.99 kg/m     Physical Exam Vitals and nursing note reviewed.  Constitutional:      General: She is not in acute distress. HENT:     Right Ear: Tympanic membrane, ear canal and external ear normal.     Left Ear: Tympanic membrane, ear canal and external ear normal.     Nose: Nose normal.  Eyes:     Extraocular Movements: Extraocular movements intact.     Conjunctiva/sclera: Conjunctivae normal.     Pupils: Pupils are equal, round, and reactive to light.  Neck:     Thyroid: No thyroid mass, thyromegaly or thyroid tenderness.  Cardiovascular:     Rate and Rhythm: Normal rate and regular rhythm.     Pulses: Normal pulses.     Heart sounds: Normal heart sounds.  Pulmonary:     Effort: Pulmonary effort is normal.     Breath sounds: Normal breath sounds.  Abdominal:     General: Bowel sounds are normal.     Palpations: Abdomen is soft.  Genitourinary:    Comments: Breast and pelvic exam deferred to GYN Musculoskeletal:        General: Normal range of motion.     Cervical back: Normal range of motion and  neck supple.     Right lower leg: No edema.     Left lower leg: No edema.  Lymphadenopathy:     Cervical: No cervical adenopathy.  Skin:    General: Skin is warm and dry.  Neurological:     Mental Status: She is alert and oriented to person, place,  and time.     Cranial Nerves: No cranial nerve deficit.  Psychiatric:        Mood and Affect: Mood normal.        Behavior: Behavior normal.        Thought Content: Thought content normal.     No results found for any visits on 12/08/22.    Assessment & Plan:    Routine Health Maintenance and Physical Exam  Immunization History  Administered Date(s) Administered   DTaP 04/11/1992, 06/07/1992, 08/08/1992, 07/23/1993, 07/03/1997   HIB (PRP-OMP) 04/08/1992, 06/07/1992, 08/08/1992, 07/23/1993   HPV Quadrivalent 03/16/2008   Hepatitis A 03/16/2008   Hepatitis A, Ped/Adol-2 Dose 05/30/2013   Hepatitis B 1992-09-25, 04/08/1992, 08/08/1992   IPV 04/08/1992, 06/07/1992, 07/23/1993, 07/03/1997   Influenza Split 09/07/2012   Influenza,inj,Quad PF,6+ Mos 09/19/2018   MMR 07/23/1993, 07/03/1997   Meningococcal Conjugate 03/16/2008   PFIZER Comirnaty(Gray Top)Covid-19 Tri-Sucrose Vaccine 03/19/2020   PFIZER(Purple Top)SARS-COV-2 Vaccination 03/19/2020   PPD Test 05/30/2013, 02/25/2022   Td 03/16/2008   Tdap 03/16/2008, 09/19/2018   Varicella 03/16/2008   Health Maintenance  Topic Date Due   Hepatitis C Screening  Never done   INFLUENZA VACCINE  01/17/2023 (Originally 05/19/2022)   HPV VACCINES (2 - 3-dose series) 09/18/2023 (Originally 04/13/2008)   COVID-19 Vaccine (3 - 2023-24 season) 11/25/2023 (Originally 06/19/2022)   PAP SMEAR-Modifier  11/30/2025   DTaP/Tdap/Td (9 - Td or Tdap) 09/19/2028   HIV Screening  Completed   Discussed health benefits of physical activity, and encouraged her to engage in regular exercise appropriate for her age and condition.  Problem List Items Addressed This Visit   None Visit Diagnoses     Encounter  for preventative adult health care exam with abnormal findings    -  Primary   Relevant Orders   Lipid panel   CBC   Encounter for lipid screening for cardiovascular disease       Relevant Orders   Comprehensive metabolic panel   Lipid panel   Amenorrhea       Relevant Orders   POCT urine pregnancy   Vitamin D deficiency       Relevant Orders   VITAMIN D 25 Hydroxy (Vit-D Deficiency, Fractures)      Return in about 1 year (around 12/09/2023) for CPE (fasting).     Wilfred Lacy, NP

## 2022-12-10 ENCOUNTER — Encounter: Payer: Self-pay | Admitting: Nurse Practitioner

## 2022-12-10 MED ORDER — VITAMIN D (ERGOCALCIFEROL) 1.25 MG (50000 UNIT) PO CAPS: 50000.0000 [IU] | ORAL_CAPSULE | ORAL | 0 refills | Status: DC

## 2022-12-10 NOTE — Progress Notes (Signed)
Abnormal: Low vit. D: start 50000IU weekly x 8weeks, new rx sent. Once completed, you will need to switch to over the counter dose 2000IU daily. Persistent abnormal lipid panel: it is essential you maintain a low fat/low carb diet. Also avoid ETOH consumption. Normal CMP, cbc and negative urine pregnancy

## 2022-12-11 ENCOUNTER — Telehealth: Payer: Self-pay | Admitting: Genetic Counselor

## 2022-12-11 NOTE — Telephone Encounter (Signed)
Called [facility] to discuss next steps about genetic testing.  Mom still waiting for GYN to disclose her genetic testing results.  Requested copy of results form Angellina's mom when available.

## 2023-04-27 ENCOUNTER — Other Ambulatory Visit: Payer: Self-pay

## 2023-04-27 ENCOUNTER — Other Ambulatory Visit (HOSPITAL_BASED_OUTPATIENT_CLINIC_OR_DEPARTMENT_OTHER): Payer: Self-pay

## 2023-04-27 ENCOUNTER — Emergency Department (HOSPITAL_BASED_OUTPATIENT_CLINIC_OR_DEPARTMENT_OTHER)
Admission: EM | Admit: 2023-04-27 | Discharge: 2023-04-27 | Disposition: A | Discharge status: Home / Self Care | Payer: BC Managed Care – PPO | Source: Home / Self Care | Attending: Emergency Medicine | Admitting: Emergency Medicine

## 2023-04-27 ENCOUNTER — Encounter (HOSPITAL_BASED_OUTPATIENT_CLINIC_OR_DEPARTMENT_OTHER): Payer: Self-pay | Admitting: Pediatrics

## 2023-04-27 DIAGNOSIS — J3489 Other specified disorders of nose and nasal sinuses: Secondary | ICD-10-CM | POA: Insufficient documentation

## 2023-04-27 DIAGNOSIS — R0981 Nasal congestion: Secondary | ICD-10-CM | POA: Insufficient documentation

## 2023-04-27 DIAGNOSIS — Z7982 Long term (current) use of aspirin: Secondary | ICD-10-CM | POA: Diagnosis not present

## 2023-04-27 DIAGNOSIS — Z91040 Latex allergy status: Secondary | ICD-10-CM | POA: Diagnosis not present

## 2023-04-27 MED ORDER — SALINE SPRAY 0.65 % NA SOLN
1.0000 | NASAL | 0 refills | Status: DC | PRN
  Filled 2023-04-27: qty 44, 5d supply, fill #0

## 2023-04-27 MED ORDER — PSEUDOEPHEDRINE HCL 30 MG PO TABS
30.0000 mg | ORAL_TABLET | ORAL | 0 refills | Status: DC | PRN
Start: 2023-04-27 — End: 2024-03-20
  Filled 2023-04-27: qty 100, 17d supply, fill #0

## 2023-04-27 NOTE — ED Provider Notes (Signed)
[facility] Provider Note   CSN: [id number] Arrival date & time: 04/27/23  1427     History  Chief Complaint  Patient presents with   Facial Pain   HPI Suzanne Mays is a 32 y.o. female presenting for nasal congestion and sinus pressure.  Symptoms started on Sunday.  States she feels her sinuses are full and nasal congestion has been persistent.  She tried to go to 3 different urgent cares today but they were "busy".  This prompted her evaluation here today.  States she is having trouble breathing through her nose but otherwise no shortness of breath or chest pain.  Denies cough and fever.  Has tried Benadryl thinking it was allergies but it did not help.  HPI     Home Medications Prior to Admission medications   Medication Sig Start Date End Date Taking? Authorizing Provider  pseudoephedrine (SUDAFED) 30 MG tablet Take 1 tablet (30 mg total) by mouth every 4 (four) hours as needed for congestion. 04/27/23  Yes Gareth Eagle, PA-C  sodium chloride (OCEAN) 0.65 % SOLN nasal spray Place 1 spray into both nostrils as needed for up to 5 days for congestion. 04/27/23 05/02/23 Yes Gareth Eagle, PA-C  aspirin-acetaminophen-caffeine (EXCEDRIN MIGRAINE) [phone] MG tablet Take 1 tablet by mouth every 8 (eight) hours as needed for headache. 09/17/22   Nche, Bonna Gains, NP  Boric Acid Vaginal 600 MG SUPP place 1 capsule in vagina twice a week AS NEEDED 11/30/22   [provider]  Vitamin D, Ergocalciferol, (DRISDOL) 1.25 MG (50000 UNIT) CAPS capsule Take 1 capsule (50,000 Units total) by mouth every 7 (seven) days. 12/10/22   Nche, Bonna Gains, NP      Allergies    Latex and Zoloft [sertraline hcl]    Review of Systems   See HPI for pertinent positives  Physical Exam  Physical Exam   Vitals:   04/27/23 1439  BP: 106/76  Pulse: 97  Resp: 16  Temp: 98.7 F (37.1 C)  SpO2: 99%    CONSTITUTIONAL:  well-appearing,  NAD NEURO:  Alert and oriented x 3, CN 3-12 grossly intact EYES:  eyes equal and reactive ENT/NECK:  Supple, no stridor, rhinorrhea noted from both nares, ears are patent and without bleeding.  No cervical lymphadenopathy.  Posterior nasopharynx appears grossly normal.  No palpable sinus tenderness. CARDIO:  appears well-perfused PULM:  No respiratory distress GI/GU:  non-distended, soft MSK/SPINE:  No gross deformities, no edema, moves all extremities  SKIN:  no rash, atraumatic  *Additional and/or pertinent findings included in MDM below  ED Results / Procedures / Treatments   Labs (all labs ordered are listed, but only abnormal results are displayed) Labs Reviewed - No data to display  EKG None  Radiology No results found.  Procedures Procedures    Medications Ordered in ED Medications - No data to display  ED Course/ Medical Decision Making/ A&P                             Medical Decision Making  32 year old well-appearing female presenting for nasal congestion. Exam notable for rhinorrhea but otherwise unremarkable.  Suspect allergies versus viral etiology. Advised conservative treatment at home. Did send Sudafed and Ocean Spray for symptomatic relief to the pharmacy. Considered sinusitis but unlikely given no palpable tenderness about the sinuses.  Advised to follow-up with her PCP if her symptoms persisted.  Final Clinical Impression(s) / ED Diagnoses Final diagnoses:  Nasal congestion    Rx / DC Orders ED Discharge Orders          Ordered    sodium chloride (OCEAN) 0.65 % SOLN nasal spray  As needed        04/27/23 1716    pseudoephedrine (SUDAFED) 30 MG tablet  Every 4 hours PRN        04/27/23 1716              Gareth Eagle, PA-C 04/27/23 1723    Melene Plan, DO 04/27/23 1724

## 2023-04-27 NOTE — ED Triage Notes (Signed)
C/O sinus congestion since Sunday, concern for infection. States - Covid test at home.

## 2023-04-27 NOTE — Discharge Instructions (Signed)
Evaluation was overall reassuring.  Sent Sudafed and nasal spray to your pharmacy for symptomatic relief.  Otherwise recommend conservative treatment at home with rest and hydration.  Recommend you follow-up with your PCP if your symptoms persist.

## 2024-03-20 ENCOUNTER — Other Ambulatory Visit (HOSPITAL_COMMUNITY)
Admission: RE | Admit: 2024-03-20 | Discharge: 2024-03-20 | Disposition: A | Discharge status: Home / Self Care | Source: Ambulatory Visit | Attending: Nurse Practitioner | Admitting: Nurse Practitioner

## 2024-03-20 ENCOUNTER — Encounter: Payer: Self-pay | Admitting: Nurse Practitioner

## 2024-03-20 ENCOUNTER — Ambulatory Visit (INDEPENDENT_AMBULATORY_CARE_PROVIDER_SITE_OTHER): Admitting: Nurse Practitioner

## 2024-03-20 VITALS — BP 110/68 | HR 74 | Temp 97.8°F | Ht 64.0 in | Wt 147.6 lb

## 2024-03-20 DIAGNOSIS — E7800 Pure hypercholesterolemia, unspecified: Secondary | ICD-10-CM | POA: Diagnosis not present

## 2024-03-20 DIAGNOSIS — E559 Vitamin D deficiency, unspecified: Secondary | ICD-10-CM | POA: Diagnosis not present

## 2024-03-20 DIAGNOSIS — Z23 Encounter for immunization: Secondary | ICD-10-CM

## 2024-03-20 DIAGNOSIS — Z1159 Encounter for screening for other viral diseases: Secondary | ICD-10-CM

## 2024-03-20 DIAGNOSIS — Z0001 Encounter for general adult medical examination with abnormal findings: Secondary | ICD-10-CM | POA: Diagnosis not present

## 2024-03-20 DIAGNOSIS — F3341 Major depressive disorder, recurrent, in partial remission: Secondary | ICD-10-CM

## 2024-03-20 DIAGNOSIS — Z131 Encounter for screening for diabetes mellitus: Secondary | ICD-10-CM

## 2024-03-20 DIAGNOSIS — Z113 Encounter for screening for infections with a predominantly sexual mode of transmission: Secondary | ICD-10-CM | POA: Insufficient documentation

## 2024-03-20 LAB — COMPREHENSIVE METABOLIC PANEL WITH GFR
ALT: 9 U/L (ref 0–35)
AST: 16 U/L (ref 0–37)
Albumin: 4.2 g/dL (ref 3.5–5.2)
Alkaline Phosphatase: 37 U/L — ABNORMAL LOW (ref 39–117)
BUN: 17 mg/dL (ref 6–23)
CO2: 29 meq/L (ref 19–32)
Calcium: 8.9 mg/dL (ref 8.4–10.5)
Chloride: 99 meq/L (ref 96–112)
Creatinine, Ser: 0.74 mg/dL (ref 0.40–1.20)
GFR: 107.26 mL/min (ref >60.00)
Glucose, Bld: 82 mg/dL (ref 70–99)
Potassium: 3.4 meq/L — ABNORMAL LOW (ref 3.5–5.1)
Sodium: 134 meq/L — ABNORMAL LOW (ref 135–145)
Total Bilirubin: 0.2 mg/dL (ref 0.2–1.2)
Total Protein: 7.7 g/dL (ref 6.0–8.3)

## 2024-03-20 LAB — LIPID PANEL
Cholesterol: 215 mg/dL — ABNORMAL HIGH (ref 0–200)
HDL: 62.6 mg/dL (ref >39.00)
LDL Cholesterol: 142 mg/dL — ABNORMAL HIGH (ref 0–99)
NonHDL: 152.68
Total CHOL/HDL Ratio: 3
Triglycerides: 51 mg/dL (ref 0.0–149.0)
VLDL: 10.2 mg/dL (ref 0.0–40.0)

## 2024-03-20 LAB — HEMOGLOBIN A1C: Hgb A1c MFr Bld: 5.8 % (ref 4.6–6.5)

## 2024-03-20 LAB — VITAMIN D 25 HYDROXY (VIT D DEFICIENCY, FRACTURES): VITD: 61.28 ng/mL (ref 30.00–100.00)

## 2024-03-20 NOTE — Assessment & Plan Note (Signed)
 Reports increased anxiety related to work. Denies need for counseling or medication

## 2024-03-20 NOTE — Assessment & Plan Note (Signed)
 Repeat vit. D today Advised to maintain OVER THE COUNTER dose 5000IU daily

## 2024-03-20 NOTE — Assessment & Plan Note (Signed)
 repeat lipid panel Advised to maintain mediterranean diet and avoid ALCOHOL consumption

## 2024-03-20 NOTE — Patient Instructions (Addendum)
 Go to lab Maintain Heart healthy diet and daily exercise. Schedule appointment for 3rd gardasil vaccine.  Mediterranean Diet A Mediterranean diet is based on the traditions of countries on the [employer]. It focuses on eating more: Fruits and vegetables. Whole grains, beans, nuts, and seeds. Heart-healthy fats. These are fats that are good for your heart. It involves eating less: Dairy. Meat and eggs. Processed foods with added sugar, salt, and fat. This type of diet can help prevent certain conditions. It can also improve outcomes if you have a long-term (chronic) disease, such as kidney or heart disease. What are tips for following this plan? Reading food labels Check packaged foods for: The serving size. For foods such as rice and pasta, the serving size is the amount of cooked product, not dry. The total fat. Avoid foods with saturated fat or trans fat. Added sugars, such as corn syrup. Shopping  Try to have a balanced diet. Buy a variety of foods, such as: Fresh fruits and vegetables. You may be able to get these from local farmers markets. You can also buy them frozen. Grains, beans, nuts, and seeds. Some of these can be bought in bulk. Fresh seafood. Poultry and eggs. Low-fat dairy products. Buy whole ingredients instead of foods that have already been packaged. If you can't get fresh seafood, buy precooked frozen shrimp or canned fish, such as tuna, salmon, or sardines. Stock your pantry so you always have certain foods on hand, such as olive oil, canned tuna, canned tomatoes, rice, pasta, and beans. Cooking Cook foods with extra-virgin olive oil instead of using butter or other vegetable oils. Have meat as a side dish. Have vegetables or grains as your main dish. This means having meat in small portions or adding small amounts of meat to foods like pasta or stew. Use beans or vegetables instead of meat in common dishes like chili or lasagna. Try out different  cooking methods. Try roasting, broiling, steaming, and sauting vegetables. Add frozen vegetables to soups, stews, pasta, or rice. Add nuts or seeds for added healthy fats and plant protein at each meal. You can add these to yogurt, salads, or vegetable dishes. Marinate fish or vegetables using olive oil, lemon juice, garlic, and fresh herbs. Meal planning Plan to eat a vegetarian meal one day each week. Try to work up to two vegetarian meals, if possible. Eat seafood two or more times a week. Have healthy snacks on hand. These may include: Vegetable sticks with hummus. Greek yogurt. Fruit and nut trail mix. Eat balanced meals. These should include: Fruit: 2-3 servings a day. Vegetables: 4-5 servings a day. Low-fat dairy: 2 servings a day. Fish, poultry, or lean meat: 1 serving a day. Beans and legumes: 2 or more servings a week. Nuts and seeds: 1-2 servings a day. Whole grains: 6-8 servings a day. Extra-virgin olive oil: 3-4 servings a day. Limit red meat and sweets to just a few servings a month. Lifestyle  Try to cook and eat meals with your family. Drink enough fluid to keep your pee (urine) pale yellow. Be active every day. This includes: Aerobic exercise, which is exercise that causes your heart to beat faster. Examples include running and swimming. Leisure activities like gardening, walking, or housework. Get 7-8 hours of sleep each night. Drink red wine if your provider says you can. A glass of wine is 5 oz (150 mL). You may be allowed to have: Up to 1 glass a day if you're female and not pregnant. Up  to 2 glasses a day if you're female. What foods should I eat? Fruits Apples. Apricots. Avocado. Berries. Bananas. Cherries. Dates. Figs. Grapes. Lemons. Melon. Oranges. Peaches. Plums. Pomegranate. Vegetables Artichokes. Beets. Broccoli. Cabbage. Carrots. Eggplant. Green beans. Chard. Kale. Spinach. Onions. Leeks. Peas. Squash. Tomatoes. Peppers. Radishes. Grains Whole-grain  pasta. Brown rice. Bulgur wheat. Polenta. Couscous. Whole-wheat bread. Dwyane Glad. Meats and other proteins Beans. Almonds. Sunflower seeds. Pine nuts. Peanuts. Cod. Salmon. Scallops. Shrimp. Tuna. Tilapia. Clams. Oysters. Eggs. Chicken or [place] without skin. Dairy Low-fat milk. Cheese. Greek yogurt. Fats and oils Extra-virgin olive oil. Avocado oil. Grapeseed oil. Beverages Water. Red wine. Herbal tea. Sweets and desserts Greek yogurt with honey. Baked apples. Poached pears. Trail mix. Seasonings and condiments Basil. Cilantro. Coriander. Cumin. Mint. Parsley. Sage. Rosemary. Tarragon. Garlic. Oregano. Thyme. Pepper. Balsamic vinegar. Tahini. Hummus. Tomato sauce. Olives. Mushrooms. The items listed above may not be all the foods and drinks you can have. Talk to a dietitian to learn more. What foods should I limit? This is a list of foods that should be eaten rarely. Fruits Fruit canned in syrup. Vegetables Deep-fried potatoes, like [place] fries. Grains Packaged pasta or rice dishes. Cereal with added sugar. Snacks with added sugar. Meats and other proteins Beef. Pork. Lamb. Chicken or [place] with skin. Hot dogs. Helene Loader. Dairy Ice cream. Sour cream. Whole milk. Fats and oils Butter. Canola oil. Vegetable oil. Beef fat (tallow). Lard. Beverages Juice. Sugar-sweetened soft drinks. Beer. Liquor and spirits. Sweets and desserts Cookies. Cakes. Pies. Candy. Seasonings and condiments Mayonnaise. Pre-made sauces and marinades. The items listed above may not be all the foods and drinks you should limit. Talk to a dietitian to learn more. Where to find more information American Heart Association (AHA): heart.org This information is not intended to replace advice given to you by your health care provider. Make sure you discuss any questions you have with your health care provider. Document Revised: 01/17/2023 Document Reviewed: 01/17/2023 Elsevier Patient Education  2024 [employer].

## 2024-03-20 NOTE — Progress Notes (Signed)
 Complete physical exam  Patient: Suzanne Mays   DOB: 1991-03-26   33 y.o. Female  MRN: [id number] Visit Date: 03/20/2024  Subjective:     Chief Complaint  Patient presents with   Annual Exam    Suzanne Mays is a 33 y.o. female who presents today for a complete physical exam. She reports consuming a general diet. Walking daily She generally feels well. She reports sleeping fairly well. She does not have additional problems to discuss today.  Vision:Yes Dental:Yes STD Screen:Yes  BP Readings from Last 3 Encounters:  03/20/24 110/68  04/27/23 106/76  12/08/22 104/66   Wt Readings from Last 3 Encounters:  03/20/24 147 lb 9.6 oz (67 kg)  04/27/23 140 lb (63.5 kg)  12/08/22 145 lb 9.6 oz (66 kg)    Most recent fall risk assessment:    03/20/2024    1:08 PM  Fall Risk   Falls in the past year? 0  Injury with Fall? 0  Risk for fall due to : No Fall Risks  Follow up Falls evaluation completed     Depression screen:Yes - Depression Most recent depression screenings:    03/20/2024    1:08 PM 12/08/2022    9:23 AM  PHQ 2/9 Scores  PHQ - 2 Score 1 1  PHQ- 9 Score 10 5    HPI  Vitamin D  deficiency Repeat vit. D today Advised to maintain OVER THE COUNTER dose 5000IU daily  Hypercholesteremia repeat lipid panel Advised to maintain mediterranean diet and avoid ALCOHOL consumption  Depression Reports increased anxiety related to work. Denies need for counseling or medication   Past Medical History:  Diagnosis Date   Abnormal Pap smear of vagina 10/05/2012   Costochondritis 08/07/2014   Musculoskeletal pain 01/16/2013   Palpitations 08/07/2014   Sleep deficient 08/07/2014   Past Surgical History:  Procedure Laterality Date   WISDOM TOOTH EXTRACTION     Social History   Socioeconomic History   Marital status: Single    Spouse name: Not on file   Number of children: Not on file   Years of education: Not on file   Highest education level: Bachelor's degree  (e.g., BA, AB, BS)  Occupational History   Not on file  Tobacco Use   Smoking status: Never   Smokeless tobacco: Never  Vaping Use   Vaping status: Never Used  Substance and Sexual Activity   Alcohol use: Yes    Comment: occ   Drug use: No   Sexual activity: Yes    Birth control/protection: None  Other Topics Concern   Not on file  Social History Narrative   Journalism/media studies- [employer]   Lives with mom and 2 dogs   Enjoys sleeping in her spare time.    Works for [employer] in [employer]   Social Drivers of [employer] Strain: Medium Risk (03/19/2024)   Overall Financial Resource Strain (CARDIA)    Difficulty of Paying Living Expenses: Somewhat hard  Food Insecurity: Food Insecurity Present (03/19/2024)   Hunger Vital Sign    Worried About Running Out of Food in the Last Year: Sometimes true    Ran Out of Food in the Last Year: Sometimes true  Transportation Needs: No Transportation Needs (03/19/2024)   PRAPARE - Administrator, Civil Service (Medical): No    Lack of Transportation (Non-Medical): No  Physical Activity: Sufficiently Active (03/19/2024)   Exercise Vital Sign    Days of Exercise per Week: 4 days  Minutes of Exercise per Session: 50 min  Stress: Stress Concern Present (03/19/2024)   [employer] of Occupational Health - Occupational Stress Questionnaire    Feeling of Stress : To some extent  Social Connections: Moderately Integrated (03/19/2024)   Social Connection and Isolation Panel [NHANES]    Frequency of Communication with Friends and Family: More than three times a week    Frequency of Social Gatherings with Friends and Family: Twice a week    Attends Religious Services: More than 4 times per year    Active Member of [employer] or Organizations: Yes    Attends Banker Meetings: 1 to 4 times per year    Marital Status: Never married  Intimate Partner Violence: Not At Risk (07/02/2023)   Received from  [facility] and [place]  [facility]   Humiliation, Afraid, Rape, and Kick questionnaire    Fear of Current or Ex-Partner: No    Emotionally Abused: No    Physically Abused: No    Sexually Abused: No   Family Status  Relation Name Status   Mother  Alive   Father  Deceased   MGM  Deceased   Other  (Not Specified)   Other  (Not Specified)  No partnership data on file   Family History  Problem Relation Age of Onset   Hypertension Mother    Diabetes Mother    Stroke Maternal Grandmother    Breast cancer Maternal Grandmother 60       mets   Breast cancer Other        MGM's sisters x3 and MGM's mother; dx after 5   Ovarian cancer Other        MGM's sister   Kidney cancer Other        MGM's sister   Allergies  Allergen Reactions   Latex    Zoloft  [Sertraline  Hcl]     Somnolence    Patient Care Team: Cloe Sockwell, Connye Delaine, NP as PCP - General (Internal Medicine) Ob/Gyn, [place] (Obstetrics and Gynecology)   Medications: Outpatient Medications Prior to Visit  Medication Sig   aspirin-acetaminophen -caffeine (EXCEDRIN  MIGRAINE) 250-250-65 MG tablet Take 1 tablet by mouth every 8 (eight) hours as needed for headache.   Boric Acid Vaginal 600 MG SUPP place 1 capsule in vagina twice a week AS NEEDED   CHELATED MAGNESIUM PO Take 2 tablets by mouth daily as needed (dysmenorrhea).   Vitamin D , Ergocalciferol , (DRISDOL ) 1.25 MG (50000 UNIT) CAPS capsule Take 1 capsule (50,000 Units total) by mouth every 7 (seven) days.   [DISCONTINUED] pseudoephedrine  (SUDAFED) 30 MG tablet Take 1 tablet (30 mg total) by mouth every 4 (four) hours as needed for congestion. (Patient not taking: Reported on 03/20/2024)   [DISCONTINUED] sodium chloride (OCEAN) 0.65 % SOLN nasal spray Place 1 spray into both nostrils as needed for up to 5 days for congestion. (Patient not taking: Reported on 03/20/2024)   No facility-administered medications prior to visit.    Review of Systems  Constitutional:   Negative for activity change, appetite change and unexpected weight change.  Respiratory: Negative.    Cardiovascular: Negative.   Gastrointestinal: Negative.   Endocrine: Negative for cold intolerance and heat intolerance.  Genitourinary: Negative.   Musculoskeletal: Negative.   Skin: Negative.   Neurological: Negative.   Hematological: Negative.   Psychiatric/Behavioral:  Positive for dysphoric mood. Negative for behavioral problems, decreased concentration, hallucinations, self-injury, sleep disturbance and suicidal ideas. The patient is not nervous/anxious.    Last lipids Lab Results  Component Value Date   CHOL 256 (H) 12/08/2022   HDL 62.60 12/08/2022   LDLCALC 180 (H) 12/08/2022   TRIG 67.0 12/08/2022   CHOLHDL 4 12/08/2022      Objective:  BP 110/68 (BP Location: Left Arm, Patient Position: Sitting, Cuff Size: Normal)   Pulse 74   Temp 97.8 F (36.6 C) (Temporal)   Ht 5\' 4"  (1.626 m)   Wt 147 lb 9.6 oz (67 kg)   LMP 03/17/2024 (Exact Date)   SpO2 96%   BMI 25.34 kg/m     Physical Exam Vitals and nursing note reviewed.  Constitutional:      General: She is not in acute distress. HENT:     Right Ear: Tympanic membrane, ear canal and external ear normal.     Left Ear: Tympanic membrane, ear canal and external ear normal.     Nose: Nose normal.  Eyes:     Extraocular Movements: Extraocular movements intact.     Conjunctiva/sclera: Conjunctivae normal.     Pupils: Pupils are equal, round, and reactive to light.  Neck:     Thyroid : No thyroid  mass, thyromegaly or thyroid  tenderness.  Cardiovascular:     Rate and Rhythm: Normal rate and regular rhythm.     Pulses: Normal pulses.     Heart sounds: Normal heart sounds.  Pulmonary:     Effort: Pulmonary effort is normal.     Breath sounds: Normal breath sounds.  Abdominal:     General: Bowel sounds are normal.     Palpations: Abdomen is soft.  Musculoskeletal:        General: Normal range of motion.      Cervical back: Normal range of motion and neck supple.     Right lower leg: No edema.     Left lower leg: No edema.  Lymphadenopathy:     Cervical: No cervical adenopathy.  Skin:    General: Skin is warm and dry.  Neurological:     Mental Status: She is alert and oriented to person, place, and time.     Cranial Nerves: No cranial nerve deficit.  Psychiatric:        Mood and Affect: Mood normal.        Behavior: Behavior normal.        Thought Content: Thought content normal.      No results found for any visits on 03/20/24.    Assessment & Plan:    Routine Health Maintenance and Physical Exam  Immunization History  Administered Date(s) Administered   DTaP 04/11/1992, 06/07/1992, 08/08/1992, 07/23/1993, 07/03/1997   Dtap, Unspecified 04/11/1992, 06/07/1992, 08/08/1992, 07/23/1993, 07/03/1997   HIB (PRP-OMP) 04/08/1992, 06/07/1992, 08/08/1992, 07/23/1993   HIB, Unspecified 04/08/1992, 06/07/1992, 08/08/1992, 07/23/1993   HPV 9-valent 03/20/2024   HPV Quadrivalent 03/16/2008   Hep B, Unspecified 23-Sep-1992, 04/08/1992, 08/08/1992   Hepatitis A 03/16/2008   Hepatitis A, Ped/Adol-2 Dose 03/16/2008, 05/30/2013   Hepatitis B 1990-11-21, 04/08/1992, 08/08/1992   IPV 04/08/1992, 06/07/1992, 07/23/1993, 07/03/1997   Influenza Split 09/07/2012   Influenza,inj,Quad PF,6+ Mos 09/19/2018   MMR 07/23/1993, 07/03/1997   Meningococcal Conjugate 03/16/2008   PFIZER Comirnaty(Gray Top)Covid-19 Tri-Sucrose Vaccine 03/19/2020   PFIZER(Purple Top)SARS-COV-2 Vaccination 03/13/2020, 03/19/2020, 04/05/2020   PPD Test 05/30/2013, 02/25/2022   Polio, Unspecified 04/08/1992, 06/07/1992, 07/23/1993, 07/03/1997   Td 03/16/2008   Tdap 03/16/2008, 09/19/2018   Varicella 03/16/2008   Health Maintenance  Topic Date Due   Hepatitis C Screening  Never done   COVID-19 Vaccine (5 - 2024-25 season) 06/20/2023  HPV VACCINES (3 - 3-dose series) 06/12/2024   INFLUENZA VACCINE  05/19/2024   Cervical  Cancer Screening (HPV/Pap Cotest)  12/11/2024   DTaP/Tdap/Td (9 - Td or Tdap) 09/19/2028   HIV Screening  Completed   Meningococcal B Vaccine  Aged Out   Discussed health benefits of physical activity, and encouraged her to engage in regular exercise appropriate for her age and condition.  Problem List Items Addressed This Visit     Depression   Reports increased anxiety related to work. Denies need for counseling or medication      Hypercholesteremia   repeat lipid panel Advised to maintain mediterranean diet and avoid ALCOHOL consumption      Relevant Orders   Lipid panel   Vitamin D  deficiency   Repeat vit. D today Advised to maintain OVER THE COUNTER dose 5000IU daily      Relevant Orders   VITAMIN D  25 Hydroxy (Vit-D Deficiency, Fractures)   Other Visit Diagnoses       Encounter for preventative adult health care exam with abnormal findings    -  Primary   Relevant Orders   Comprehensive metabolic panel with GFR   Hemoglobin A1c     Immunization due       Relevant Orders   HPV 9-valent vaccine,Recombinat (Completed)     Encounter for screening for viral disease       Relevant Orders   Hepatitis C antibody   HIV Antibody (routine testing w rflx)   RPR   Urine cytology ancillary only      Return in about 1 year (around 03/20/2025) for CPE (fasting).     Kathrene Parents, NP

## 2024-03-21 LAB — URINE CYTOLOGY ANCILLARY ONLY
Chlamydia: NEGATIVE
Comment: NEGATIVE
Comment: NEGATIVE
Comment: NORMAL
Neisseria Gonorrhea: NEGATIVE
Trichomonas: NEGATIVE

## 2024-03-21 LAB — HEPATITIS C ANTIBODY: Hepatitis C Ab: NONREACTIVE

## 2024-03-21 LAB — HIV ANTIBODY (ROUTINE TESTING W REFLEX): HIV 1&2 Ab, 4th Generation: NONREACTIVE

## 2024-03-21 LAB — RPR: RPR Ser Ql: NONREACTIVE

## 2024-03-22 ENCOUNTER — Ambulatory Visit: Payer: Self-pay | Admitting: Nurse Practitioner

## 2024-06-27 ENCOUNTER — Other Ambulatory Visit (HOSPITAL_BASED_OUTPATIENT_CLINIC_OR_DEPARTMENT_OTHER): Payer: Self-pay

## 2024-06-27 ENCOUNTER — Other Ambulatory Visit: Payer: Self-pay

## 2024-06-27 ENCOUNTER — Encounter (HOSPITAL_BASED_OUTPATIENT_CLINIC_OR_DEPARTMENT_OTHER): Payer: Self-pay | Admitting: Emergency Medicine

## 2024-06-27 ENCOUNTER — Emergency Department (HOSPITAL_BASED_OUTPATIENT_CLINIC_OR_DEPARTMENT_OTHER)
Admission: EM | Admit: 2024-06-27 | Discharge: 2024-06-27 | Disposition: A | Discharge status: Home / Self Care | Attending: Emergency Medicine | Admitting: Emergency Medicine

## 2024-06-27 DIAGNOSIS — T148XXA Other injury of unspecified body region, initial encounter: Secondary | ICD-10-CM

## 2024-06-27 DIAGNOSIS — Y92481 Parking lot as the place of occurrence of the external cause: Secondary | ICD-10-CM | POA: Diagnosis not present

## 2024-06-27 DIAGNOSIS — S161XXA Strain of muscle, fascia and tendon at neck level, initial encounter: Secondary | ICD-10-CM | POA: Insufficient documentation

## 2024-06-27 DIAGNOSIS — S39012A Strain of muscle, fascia and tendon of lower back, initial encounter: Secondary | ICD-10-CM | POA: Diagnosis not present

## 2024-06-27 DIAGNOSIS — Z91040 Latex allergy status: Secondary | ICD-10-CM | POA: Diagnosis not present

## 2024-06-27 DIAGNOSIS — S199XXA Unspecified injury of neck, initial encounter: Secondary | ICD-10-CM | POA: Diagnosis present

## 2024-06-27 MED ORDER — LIDOCAINE 5 % EX PTCH
2.0000 | MEDICATED_PATCH | Freq: Once | CUTANEOUS | Status: DC
Start: 2024-06-27 — End: 2024-06-27
  Administered 2024-06-27: 2 via TRANSDERMAL
  Filled 2024-06-27: qty 2

## 2024-06-27 MED ORDER — ACETAMINOPHEN 500 MG PO TABS
1000.0000 mg | ORAL_TABLET | Freq: Once | ORAL | Status: AC
  Administered 2024-06-27: 1000 mg via ORAL
  Filled 2024-06-27: qty 2

## 2024-06-27 MED ORDER — KETOROLAC TROMETHAMINE 15 MG/ML IJ SOLN
15.0000 mg | Freq: Once | INTRAMUSCULAR | Status: AC
  Administered 2024-06-27: 15 mg via INTRAMUSCULAR
  Filled 2024-06-27: qty 1

## 2024-06-27 MED ORDER — CYCLOBENZAPRINE HCL 10 MG PO TABS
10.0000 mg | ORAL_TABLET | Freq: Two times a day (BID) | ORAL | 0 refills | Status: DC | PRN
  Filled 2024-06-27: qty 20, 10d supply, fill #0
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
## 2024-07-21 ENCOUNTER — Encounter: Payer: Self-pay | Admitting: Nurse Practitioner

## 2024-07-21 ENCOUNTER — Encounter (HOSPITAL_BASED_OUTPATIENT_CLINIC_OR_DEPARTMENT_OTHER): Payer: Self-pay

## 2024-07-21 ENCOUNTER — Other Ambulatory Visit (HOSPITAL_BASED_OUTPATIENT_CLINIC_OR_DEPARTMENT_OTHER): Payer: Self-pay

## 2024-07-21 ENCOUNTER — Other Ambulatory Visit: Payer: Self-pay | Admitting: Nurse Practitioner

## 2024-07-21 ENCOUNTER — Ambulatory Visit: Admitting: Nurse Practitioner

## 2024-07-21 VITALS — BP 108/66 | HR 96 | Temp 98.0°F | Ht 64.0 in | Wt 146.6 lb

## 2024-07-21 DIAGNOSIS — K644 Residual hemorrhoidal skin tags: Secondary | ICD-10-CM

## 2024-07-21 DIAGNOSIS — F3341 Major depressive disorder, recurrent, in partial remission: Secondary | ICD-10-CM | POA: Diagnosis not present

## 2024-07-21 DIAGNOSIS — Z23 Encounter for immunization: Secondary | ICD-10-CM

## 2024-07-21 DIAGNOSIS — R7301 Impaired fasting glucose: Secondary | ICD-10-CM | POA: Diagnosis not present

## 2024-07-21 DIAGNOSIS — Z636 Dependent relative needing care at home: Secondary | ICD-10-CM

## 2024-07-21 DIAGNOSIS — E7800 Pure hypercholesterolemia, unspecified: Secondary | ICD-10-CM

## 2024-07-21 DIAGNOSIS — K5900 Constipation, unspecified: Secondary | ICD-10-CM

## 2024-07-21 LAB — LIPID PANEL
Cholesterol: 227 mg/dL — ABNORMAL HIGH (ref 0–200)
HDL: 54.8 mg/dL (ref >39.00)
LDL Cholesterol: 160 mg/dL — ABNORMAL HIGH (ref 0–99)
NonHDL: 172.32
Total CHOL/HDL Ratio: 4
Triglycerides: 61 mg/dL (ref 0.0–149.0)
VLDL: 12.2 mg/dL (ref 0.0–40.0)

## 2024-07-21 LAB — HEMOGLOBIN A1C: Hgb A1c MFr Bld: 5.8 % (ref 4.6–6.5)

## 2024-07-21 MED ORDER — HYDROCORTISONE ACETATE 25 MG RE SUPP
25.0000 mg | Freq: Two times a day (BID) | RECTAL | 0 refills | Status: DC
  Filled 2024-07-21: qty 12, 6d supply, fill #0

## 2024-07-21 NOTE — Assessment & Plan Note (Signed)
 Secondary to constipation. Admits to poor diet due to increase stress Noted blood when she wipes and streaks on stool. Describes stool as hard pellets.  Sent proctosol suppository Advised to Maintain Heart healthy diet and daily exercise. Use miralax or colace for constipation Maintain adequate oral hydration and high fiber diet. Call office if hemorrhoid and rectal pain does not resolve in 1week.

## 2024-07-21 NOTE — Patient Instructions (Addendum)
 Go to lab Maintain Heart healthy diet and daily exercise. Use miralax or colace for constipation Maintain adequate oral hydration and high fiber diet. Call office if hemorrhoid and rectal pain does not resolve in 1week.  Hemorrhoids Hemorrhoids are swollen veins that may form: In the butt (rectum). These are called internal hemorrhoids. Around the opening of the butt (anus). These are called external hemorrhoids. Most hemorrhoids do not cause very bad problems. They often get better with changes to your lifestyle and what you eat. What are the causes? Having trouble pooping (constipation) or watery poop (diarrhea). Pushing too hard when you poop. Pregnancy. Being very overweight (obese). Sitting for too long. Riding a bike for a long time. Heavy lifting or other things that take a lot of effort. Anal sex. What are the signs or symptoms? Pain. Itching or soreness in the butt. Bleeding from the butt. Leaking poop. Swelling. One or more lumps around the opening of your butt. How is this treated? In most cases, hemorrhoids can be treated at home. You may be told to: Change what you eat. Make changes to your lifestyle. If these treatments do not help, you may need to have a procedure done. Your doctor may need to: Place rubber bands at the bottom of the hemorrhoids to make them fall off. Put medicine into the hemorrhoids to shrink them. Shine a type of light on the hemorrhoids to cause them to fall off. Do surgery to get rid of the hemorrhoids. Follow these instructions at home: Medicines Take over-the-counter and prescription medicines only as told by your doctor. Use creams with medicine in them or medicines that you put in your butt as told by your doctor. Eating and drinking  Eat foods that have a lot of fiber in them. These include whole grains, beans, nuts, fruits, and vegetables. Ask your doctor about taking products that have fiber added to them (fibersupplements). Take  in less fat. You can do this by: Eating low-fat dairy products. Eating less red meat. Staying away from processed foods. Drink enough fluid to keep your pee (urine) pale yellow. Managing pain and swelling  Take a warm-water bath (sitz bath) for 20 minutes to ease pain. Do this 3-4 times a day. You may do this in a bathtub. You may also use a portable sitz bath that fits over the toilet. If told, put ice on the painful area. It may help to use ice between your warm baths. Put ice in a plastic bag. Place a towel between your skin and the bag. Leave the ice on for 20 minutes, 2-3 times a day. If your skin turns bright red, take off the ice right away to prevent skin damage. The risk of damage is higher if you cannot feel pain, heat, or cold. General instructions Exercise. Ask your doctor how much and what kind of exercise is best for you. Go to the bathroom when you need to poop. Do not wait. Try not to push too hard when you poop. Keep your butt dry and clean. Use wet toilet paper or moist towelettes after you poop. Do not sit on the toilet for a long time. Contact a doctor if: You have pain and swelling that do not get better with treatment. You have trouble pooping. You cannot poop. You have pain or swelling outside the area of the hemorrhoids. Get help right away if: You have bleeding from the butt that will not stop. This information is not intended to replace advice given to you  by your health care provider. Make sure you discuss any questions you have with your health care provider. Document Revised: 06/17/2022 Document Reviewed: 06/17/2022 Elsevier Patient Education  2024 [employer].

## 2024-07-21 NOTE — Progress Notes (Signed)
 Established Patient Visit  Patient: Suzanne Mays   DOB: [birth year]   33 y.o. Female  MRN: [id number] Visit Date: 07/21/2024  Subjective:    Chief Complaint  Patient presents with   Follow-up    4 month follow up  Hemorrhoids blood in stool and when wiping started this week   Requesting refills for muscle relaxer,    HPI Depression Reports increased anxiety, irritability, restlessness due to mother's cancer diagnosis. 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 She denies need for medication. She agreed to psychology referral. F/up in 3months  External hemorrhoid, bleeding Secondary to constipation. Admits to poor diet due to increase stress Noted blood when she wipes and streaks on stool. Describes stool as hard pellets.  Sent proctosol suppository Advised to Maintain Heart healthy diet and daily exercise. Use miralax or colace for constipation Maintain adequate oral hydration and high fiber diet. Call office if hemorrhoid and rectal pain does not resolve in 1week.     07/21/2024    9:24 AM 03/20/2024    1:08 PM 12/08/2022    9:23 AM  Depression screen PHQ 2/9  Decreased Interest 2 1 1   Down, Depressed, Hopeless 2 0 0  PHQ - 2 Score 4 1 1   Altered sleeping 2 3 2   Tired, decreased energy 3 3 1   Change in appetite 0 1 0  Feeling bad or failure about yourself  0 0 0  Trouble concentrating 2 1 1   Moving slowly or fidgety/restless 2 1 0  Suicidal thoughts 0 0 0  PHQ-9 Score 13 10 5   Difficult doing work/chores Somewhat difficult Somewhat difficult Not difficult at all       07/21/2024    9:24 AM 03/20/2024    1:09 PM 12/08/2022    9:24 AM 09/17/2022   11:27 AM  GAD 7 : Generalized Anxiety Score  Nervous, Anxious, on Edge 3 2 1 1   Control/stop worrying 3 2 1 1   Worry too much - different things 3 2 1 1   Trouble relaxing 3 2 0 1  Restless 3 2 0 0  Easily annoyed or irritable 3 2 1 1   Afraid - awful might happen 2 3 0 1  Total GAD 7  Score 20 15 4 6   Anxiety Difficulty Somewhat difficult Somewhat difficult Not difficult at all Somewhat difficult   Reviewed medical, surgical, and social history today  Medications: Outpatient Medications Prior to Visit  Medication Sig   aspirin-acetaminophen -caffeine (EXCEDRIN  MIGRAINE) 250-250-65 MG tablet Take 1 tablet by mouth every 8 (eight) hours as needed for headache.   Boric Acid Vaginal 600 MG SUPP place 1 capsule in vagina twice a week AS NEEDED   CHELATED MAGNESIUM PO Take 2 tablets by mouth daily as needed (dysmenorrhea).   cyclobenzaprine  (FLEXERIL ) 10 MG tablet Take 1 tablet (10 mg total) by mouth 2 (two) times daily as needed for muscle spasms.   Vitamin D , Ergocalciferol , (DRISDOL ) 1.25 MG (50000 UNIT) CAPS capsule Take 1 capsule (50,000 Units total) by mouth every 7 (seven) days.   No facility-administered medications prior to visit.   Reviewed past medical and social history.   ROS per HPI above      Objective:  BP 108/66 (BP Location: Left Arm, Patient Position: Sitting, Cuff Size: Normal)   Pulse 96   Temp 98 F (36.7 C) (Oral)   Ht 5' 4 (1.626 m)  Wt 146 lb 9.6 oz (66.5 kg)   LMP 07/20/2024   SpO2 98%   BMI 25.16 kg/m      Physical Exam Vitals and nursing note reviewed.  Cardiovascular:     Rate and Rhythm: Normal rate.     Pulses: Normal pulses.  Pulmonary:     Effort: Pulmonary effort is normal.  Genitourinary:    Rectum: External hemorrhoid present.  Neurological:     Mental Status: She is alert and oriented to person, place, and time.  Psychiatric:        Mood and Affect: Mood normal.        Behavior: Behavior normal.        Thought Content: Thought content normal.     No results found for any visits on 07/21/24.    Assessment & Plan:    Problem List Items Addressed This Visit     Depression - Primary   Reports increased anxiety, irritability, restlessness due to mother's cancer diagnosis. She is the primary caregiver and has to  commute [place]. She denies need for medication. She agreed to psychology referral. F/up in 3months      Relevant Orders   Ambulatory referral to Psychology   External hemorrhoid, bleeding   Secondary to constipation. Admits to poor diet due to increase stress Noted blood when she wipes and streaks on stool. Describes stool as hard pellets.  Sent proctosol suppository Advised to Maintain Heart healthy diet and daily exercise. Use miralax or colace for constipation Maintain adequate oral hydration and high fiber diet. Call office if hemorrhoid and rectal pain does not resolve in 1week.       Relevant Medications   hydrocortisone (ANUSOL-HC) 25 MG suppository   Hypercholesteremia   Relevant Orders   Lipid panel   Other Visit Diagnoses       Impaired fasting glucose       Relevant Orders   Hemoglobin A1c     Caregiver stress       Relevant Orders   Ambulatory referral to Psychology     Constipation, unspecified constipation type         Immunization due       Relevant Orders   HPV 9-valent vaccine,Recombinat (Completed)      Return in about 3 months (around 10/21/2024) for depression and anxiety, prediabetes, hyperlipidemia (fasting).     Roselie Mood, NP

## 2024-07-21 NOTE — Assessment & Plan Note (Signed)
 Reports increased anxiety, irritability, restlessness due to mother's cancer diagnosis. 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 She denies need for medication. She agreed to psychology referral. F/up in 3months

## 2024-07-24 ENCOUNTER — Other Ambulatory Visit (HOSPITAL_BASED_OUTPATIENT_CLINIC_OR_DEPARTMENT_OTHER): Payer: Self-pay

## 2024-07-24 MED ORDER — CYCLOBENZAPRINE HCL 10 MG PO TABS
10.0000 mg | ORAL_TABLET | Freq: Every evening | ORAL | 0 refills | Status: DC | PRN
  Filled 2024-07-24: qty 14, 14d supply, fill #0

## 2024-07-25 ENCOUNTER — Ambulatory Visit: Payer: Self-pay | Admitting: Nurse Practitioner

## 2024-07-31 ENCOUNTER — Other Ambulatory Visit (HOSPITAL_BASED_OUTPATIENT_CLINIC_OR_DEPARTMENT_OTHER): Payer: Self-pay

## 2024-07-31 MED ORDER — PROMETHAZINE-DM 6.25-15 MG/5ML PO SYRP
5.0000 mL | ORAL_SOLUTION | ORAL | 0 refills | Status: DC
  Filled 2024-07-31: qty 180, 6d supply, fill #0

## 2024-07-31 MED ORDER — LIDOCAINE VISCOUS HCL 2 % MT SOLN
5.0000 mL | Freq: Four times a day (QID) | OROMUCOSAL | 0 refills | Status: DC | PRN
  Filled 2024-07-31: qty 100, 5d supply, fill #0

## 2024-07-31 MED ORDER — LIDOCAINE VISCOUS HCL 2 % MT SOLN: OROMUCOSAL | 0 refills | Status: DC

## 2024-08-02 ENCOUNTER — Other Ambulatory Visit (HOSPITAL_BASED_OUTPATIENT_CLINIC_OR_DEPARTMENT_OTHER): Payer: Self-pay

## 2024-08-14 ENCOUNTER — Ambulatory Visit (INDEPENDENT_AMBULATORY_CARE_PROVIDER_SITE_OTHER): Admitting: Licensed Clinical Social Worker

## 2024-08-14 DIAGNOSIS — F4323 Adjustment disorder with mixed anxiety and depressed mood: Secondary | ICD-10-CM | POA: Diagnosis not present
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
## 2024-08-21 ENCOUNTER — Encounter: Payer: Self-pay | Admitting: Nurse Practitioner

## 2024-08-21 NOTE — Telephone Encounter (Signed)
 Form filled out and sign by provider at office appointment

## 2024-08-24 ENCOUNTER — Ambulatory Visit: Admitting: Licensed Clinical Social Worker

## 2024-08-24 ENCOUNTER — Encounter: Payer: Self-pay | Admitting: Licensed Clinical Social Worker

## 2024-08-24 DIAGNOSIS — F4323 Adjustment disorder with mixed anxiety and depressed mood: Secondary | ICD-10-CM
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
## 2024-08-31 ENCOUNTER — Ambulatory Visit: Admitting: Licensed Clinical Social Worker

## 2024-08-31 DIAGNOSIS — F4323 Adjustment disorder with mixed anxiety and depressed mood: Secondary | ICD-10-CM | POA: Diagnosis not present

## 2024-08-31 NOTE — Progress Notes (Signed)
 [facility] Counselor/Therapist Progress Note  Patient ID: Suzanne Mays, MRN: [id number]    Date: 08/31/24  Time Spent: 2:01  pm - 3:02 pm : 62 Minutes  Treatment Type: Individual Therapy.  Reported Symptoms: Patient presented bright, engaged, and future-oriented. Mood euthymic, affect congruent. Demonstrates insight and motivation for change.  Mental Status Exam: Appearance:  Casual     Behavior: Appropriate and Sharing  Motor: Normal  Speech/Language:  Normal Rate  Affect: Appropriate  Mood: normal  Thought process: normal  Thought content:   WNL  Sensory/Perceptual disturbances:   WNL  Orientation: oriented to person, place, and time/date  Attention: Good  Concentration: Good  Memory: WNL  Fund of knowledge:  Good  Insight:   Good  Judgment:  Good  Impulse Control: Good   Risk Assessment: Danger to Self:  No Self-injurious Behavior: No Danger to Others: No Duty to Warn:no Physical Aggression / Violence:No  Access to Firearms a concern: No  Gang Involvement:No   Subjective:   Suzanne Mays participated from home, via video, and consented to treatment. I discussed the limitations of evaluation and management by telemedicine and the availability of in person appointments. The patient expressed understanding and agreed to proceed.  Therapist participated from home office.  Suzanne Mays reviewed the events of the past week. Patient reports positive feedback from her mother noting observable improvement, which increased her motivation and optimism. Expresses readiness to begin new goal of increasing self-awareness of emotional triggers related to caregiving and relationships. Reports ongoing insomnia and late eating patterns; open to adjusting dinner time and tracking sleep. Notes daily weight checking and mild concern over recent weight gain since relocation; processed emotional and behavioral aspects during session.     Interventions: Mindfulness Meditation and  Psycho-education/Bibliotherapy  Diagnosis:  Adjustment disorder with mixed anxiety and depressed mood  Psychiatric Treatment: No , N/A  Treatment Plan:  Client Abilities/Strengths Suzanne Mays is open to sessions.   Support System: Family and friends  Merchant [employer] Officer of Needs Suzanne Mays would like to identify triggers for anxiety and fear, particularly related to caregiving and interpersonal stress.    Treatment Level Weekly  Symptoms  reflective   (Status: maintained) optimistic   (Status: improved)  Goals:   Suzanne Mays Adjustment and anxiety symptoms related to caregiving and self-image. Improved mood and readiness for deeper work. Sleep disturbance and weight-checking behavior to be monitored as potential anxiety-related coping mechanisms. Continue weekly sessions focusing on emotional awareness, coping, and assertive communication. Patient to explore three gym options, implement earlier dinner time, and journal reflections on body image and weight-checking behaviors. Review progress and sleep tracking next session.  Target Date: 10/23/2024 Frequency: Weekly  Progress: 0 Modality: individual    Therapist will provide referrals for additional resources as appropriate.  Therapist will provide psycho-education regarding Suzanne Mays diagnosis and corresponding treatment approaches and interventions. Licensed Clinical Mental Health Counselor, [name], [facility] will support the patient's ability to achieve the goals identified. will employ CBT, BA, Problem-solving, Solution Focused, Mindfulness,  coping skills, & other evidenced-based practices will be used to promote progress towards healthy functioning to help manage decrease symptoms associated with her diagnosis.   Reduce overall level, frequency, and intensity of the feelings of depression, anxiety and panic evidenced by decreased emotional abandonment from 6 to 7 days/week to 0 to 1 days/week per  client report for at least 3 consecutive months. Verbally express understanding of the relationship between feelings of depression, anxiety and their impact on thinking patterns  and behaviors. Verbalize an understanding of the role that distorted thinking plays in creating fears, excessive worry, and ruminations.  (Suzanne Mays participated in the creation of the treatment plan)   [name], LCMHC

## 2024-09-05 ENCOUNTER — Encounter: Payer: Self-pay | Admitting: Licensed Clinical Social Worker

## 2024-09-07 ENCOUNTER — Ambulatory Visit: Admitting: Licensed Clinical Social Worker

## 2024-09-07 DIAGNOSIS — F4323 Adjustment disorder with mixed anxiety and depressed mood: Secondary | ICD-10-CM | POA: Diagnosis not present

## 2024-09-07 NOTE — Progress Notes (Signed)
 [facility] Counselor/Therapist Progress Note  Patient ID: Suzanne Mays, MRN: [id number]    Date: 09/07/24  Time Spent: 12:05  pm - 1:00 pm : 55 Minutes  Treatment Type: Individual Therapy.  Reported Symptoms: Patient engaged, future-oriented, and affect notably brighter. Demonstrates increasing insight into emotional triggers and relational patterns. No safety concerns observed.  Mental Status Exam: Appearance:  Casual     Behavior: Appropriate and Sharing  Motor: Normal  Speech/Language:  Normal Rate  Affect: Appropriate  Mood: normal  Thought process: flight of ideas and tangential  Thought content:   WNL  Sensory/Perceptual disturbances:   WNL  Orientation: oriented to person, place, and time/date  Attention: Good  Concentration: Good  Memory: WNL  Fund of knowledge:  Good  Insight:   Good  Judgment:  Good  Impulse Control: Good   Risk Assessment: Danger to Self:  No Self-injurious Behavior: No Danger to Others: No Duty to Warn:no Physical Aggression / Violence:No  Access to Firearms a concern: No  Gang Involvement:No   Subjective:   Suzanne Mays participated from home, via video, and consented to treatment. I discussed the limitations of evaluation and management by telemedicine and the availability of in person appointments. The patient expressed understanding and agreed to proceed.  Therapist participated from home office.  Suzanne Mays reviewed the events of the past week. Patient reports progress on goal of increasing self-awareness of emotional triggers tied to anxiety in caregiving and relationships. 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 Patient notes strong support from supervisors, who are also caregivers, and expresses no desire to leave her job. She will begin traveling to [place] twice weekly after [facility] Day 2026 and feels  confident she can plan around her mother's chemo schedule with workplace flexibility. Shares excitement about reconnecting with a childhood friend who has shown genuine interest; identifies hopeful feelings about future relationship possibilities. Denies acute distress.     Interventions: Insight-Oriented  Diagnosis:  Adjustment disorder with mixed anxiety and depressed mood  Psychiatric Treatment: No , N/A  Treatment Plan:  Client Abilities/Strengths Suzanne Mays is open to sessions.    Support System: Family and Friends   [employer] Officer of Needs Suzanne Mays would like to identify triggers for anxiety and fear, particularly related to caregiving and interpersonal stress.     Treatment Level Weekly  Symptoms  optimistic   (Status: improved) Reflective/mindful   (Status: improved)  Goals:   Suzanne Mays Patient making meaningful progress toward goal of enhancing emotional awareness and coping. Strong family and workplace support contributing to reduced anxiety. Improved confidence in caregiving role. Emerging positive interpersonal connection supporting emotional growth and healing post-breakup. Continued work needed on assertive communication and managing anticipatory stress related to upcoming travel and caregiving logistics.   Target Date: 10/23/2024 Frequency: Weekly  Progress: 0 Modality: individual    Therapist will provide referrals for additional resources as appropriate.  Therapist will provide psycho-education regarding Suzanne Mays's diagnosis and corresponding treatment approaches and interventions. Licensed Clinical Mental Health Counselor, [name], [facility] will support the patient's ability to achieve the goals identified. will employ CBT, BA, Problem-solving, Solution Focused, Mindfulness,  coping skills, & other evidenced-based practices will be used to promote progress towards healthy functioning to help manage decrease symptoms  associated with her diagnosis.   Reduce overall level, frequency, and intensity of the feelings of depression, anxiety and panic evidenced by decreased emotional abandonment  from 6 to 7 days/week to 0 to 1 days/week per client report for at least 3 consecutive months. Verbally express understanding of the relationship between feelings of depression, anxiety and their impact on thinking patterns and behaviors. Verbalize an understanding of the role that distorted thinking plays in creating fears, excessive worry, and ruminations.  (De participated in the creation of the treatment plan)   [name], LCMHC

## 2024-09-21 ENCOUNTER — Ambulatory Visit: Admitting: Licensed Clinical Social Worker

## 2024-09-21 DIAGNOSIS — F4323 Adjustment disorder with mixed anxiety and depressed mood: Secondary | ICD-10-CM

## 2024-09-21 NOTE — Progress Notes (Signed)
 [facility] Counselor/Therapist Progress Note  Patient ID: Suzanne Mays, MRN: [id number]    Date: 09/21/24  Time Spent: 11:01  am - 11:59 am : 58 Minutes  Treatment Type: Individual Therapy.  Reported Symptoms: Pt alert, oriented, engaged. Mood mildly stressed but cooperative; affect appropriate. No SI/HI reported. Insight growing; motivated for change.  Mental Status Exam: Appearance:  Casual     Behavior: Appropriate and Sharing  Motor: Normal  Speech/Language:  Normal Rate  Affect: Appropriate  Mood: normal  Thought process: normal  Thought content:   WNL  Sensory/Perceptual disturbances:   WNL  Orientation: oriented to person, place, and time/date  Attention: Good  Concentration: Good  Memory: WNL  Fund of knowledge:  Good  Insight:   Good  Judgment:  Good  Impulse Control: Good   Risk Assessment: Danger to Self:  No Self-injurious Behavior: No Danger to Others: No Duty to Warn:no Physical Aggression / Violence:No  Access to Firearms a concern: No  Gang Involvement:No   Subjective:   Suzanne Mays participated from home, via video, and consented to treatment. I discussed the limitations of evaluation and management by telemedicine and the availability of in person appointments. The patient expressed understanding and agreed to proceed.  Therapist participated from home office.  Suzanne Mays reviewed the events of the past week. Pt reports she enjoyed time with family visiting over the holidays but continues to feel emotionally heavy due to ongoing caregiver responsibilities. Pt notes a new romantic interest who is trying to support her; however, she acknowledges feeling guarded and fears she may push him away due to stress and difficulty asking for or accepting help.     Interventions: Solution-Oriented/Positive Psychology and Insight-Oriented  Diagnosis:  Adjustment disorder with mixed anxiety and depressed mood   Psychiatric Treatment: No ,  N/A  Treatment Plan:  Client Abilities/Strengths Suzanne Mays is open to sessions.    Support System: Family and Friends   [employer] Officer of Needs Suzanne Mays would like to identify triggers for anxiety and fear, particularly related to caregiving and interpersonal stress.    Treatment Level Weekly  Symptoms  overwhelmed   (Status: maintained) Self sabotaging   (Status: declined)  Goals:   Suzanne Mays Caregiver strain contributing to emotional exhaustion and relationship avoidance. Difficulty with vulnerability and delegating tasks. Emerging supportive relationship may be impacted by pt's stress response. Pt benefits from structured communication and coping tools. Pt to have a candid conversation with new partner to express needs and allow supportive connection. Pt to re-engage family in shared caregiver system, including creating a group text for coordinated updates and task distribution. Pt to implement action steps before next session; out-of-state travel noted. Therapist to send mindfulness video for stress regulation. Pt may reach out if crisis escalates; next session in two weeks.   Target Date: 10/23/2024 Frequency: Weekly  Progress: 0 Modality: individual    Therapist will provide referrals for additional resources as appropriate.  Therapist will provide psycho-education regarding Suzanne Mays's diagnosis and corresponding treatment approaches and interventions. Licensed Clinical Mental Health Counselor, [name], [facility] will support the patient's ability to achieve the goals identified. will employ CBT, BA, Problem-solving, Solution Focused, Mindfulness,  coping skills, & other evidenced-based practices will be used to promote progress towards healthy functioning to help manage decrease symptoms associated with her diagnosis.   Reduce overall level, frequency, and intensity of the feelings of depression, anxiety and panic evidenced by decreased  symptoms associated with her diagnosis.  from 6 to  7 days/week to 0 to 1 days/week per client report for at least 3 consecutive months. Verbally express understanding of the relationship between feelings of depression, anxiety and their impact on thinking patterns and behaviors. Verbalize an understanding of the role that distorted thinking plays in creating fears, excessive worry, and ruminations.  (Suzanne Mays participated in the creation of the treatment plan)   [name], [facility]      [name], LCMHC

## 2024-10-05 ENCOUNTER — Ambulatory Visit: Admitting: Licensed Clinical Social Worker

## 2024-10-05 DIAGNOSIS — F4323 Adjustment disorder with mixed anxiety and depressed mood: Secondary | ICD-10-CM | POA: Diagnosis not present

## 2024-10-05 NOTE — Progress Notes (Unsigned)
 [facility] Counselor/Therapist Progress Note  Patient ID: Suzanne Mays, MRN: [id number]    Date: 10/05/2024  Time Spent: 10:02  am - 11:00 am : 58 Minutes  Treatment Type: Individual Therapy.  Reported Symptoms: Patient appeared fatigued and emotionally overwhelmed. Affect anxious and tearful at times but appropriate. Thought process coherent. No safety concerns noted.  Mental Status Exam: Appearance:  Well Groomed     Behavior: Appropriate, Sharing, and Rationalizing  Motor: Normal  Speech/Language:  Normal Rate  Affect: Appropriate  Mood: anxious  Thought process: circumstantial  Thought content:   WNL  Sensory/Perceptual disturbances:   WNL  Orientation: oriented to person, place, and time/date  Attention: Good  Concentration: Good  Memory: WNL  Fund of knowledge:  Good  Insight:   Good  Judgment:  Good  Impulse Control: Good   Risk Assessment: Danger to Self:  No Self-injurious Behavior: No Danger to Others: No Duty to Warn:no Physical Aggression / Violence:No  Access to Firearms a concern: No  Gang Involvement:No   Subjective:   Antonio Bohlin participated from home, via video, and consented to treatment. I discussed the limitations of evaluation and management by telemedicine and the availability of in person appointments. The patient expressed understanding and agreed to proceed.  Therapist participated from home office.  Reyann reviewed the events of the past week.      Interventions: Assertiveness/Communication and Narrative  Diagnosis:  Adjustment disorder with mixed anxiety and depressed mood  Psychiatric Treatment: No , N/A  Treatment Plan:  Client Abilities/Strengths Anetha is open to sessions.  Patient experiencing caregiver stress, emotional exhaustion, and low self-advocacy. Difficulty with assertive communication and reduced self-esteem are contributing to unmet needs and overwhelm. Insight present; motivation for skill development  remains intact.  Support System: Family and Friends   [employer] Officer of Needs Shaquella would like to identify triggers for anxiety and fear, particularly related to caregiving and interpersonal stress.       Treatment Level Weekly  Symptoms  overwhelmed   (Status: maintained) fatigued   (Status: declined)  Goals:   Audre Patient used session to vent and process recent events, reporting significant overwhelm despite managing multiple responsibilities. She endorsed minimal time for self-care and difficulty asking for or following up on support due to fear of being perceived as pushy. Patient identified high caregiver burden as primary caregiver for her mother undergoing active chemotherapy. She reported distress related to sisters domineering and critical behavior, which triggers longstanding feedback of being too nice. Therapeutic focus will continue on building assertive communication and boundary-setting to support clearer expression of needs, while addressing caregiver stress and normalizing the need for support. Treatment will target strengthening self-esteem and challenging internalized beliefs about being too nice, alongside encouraging intentional, realistic self-care. Progress will be reviewed in the next session.  Target Date: 10/23/2024 Frequency: Weekly  Progress: 0 Modality: individual    Therapist will provide referrals for additional resources as appropriate.  Therapist will provide psycho-education regarding Delvina's diagnosis and corresponding treatment approaches and interventions. Licensed Clinical Mental Health Counselor, [name], [facility] will support the patient's ability to achieve the goals identified. will employ CBT, BA, Problem-solving, Solution Focused, Mindfulness,  coping skills, & other evidenced-based practices will be used to promote progress towards healthy functioning to help manage decrease symptoms  associated with her diagnosis.   Reduce overall level, frequency, and intensity of the feelings of depression, anxiety and panic evidenced by decreased self abandonment from 6 to 7 days/week  to 0 to 1 days/week per client report for at least 3 consecutive months. Verbally express understanding of the relationship between feelings of depression, anxiety and their impact on thinking patterns and behaviors. Verbalize an understanding of the role that distorted thinking plays in creating fears, excessive worry, and ruminations.  (Terree participated in the creation of the treatment plan)   [name], LCMHC

## 2024-10-11 ENCOUNTER — Ambulatory Visit: Admitting: Licensed Clinical Social Worker

## 2024-10-11 DIAGNOSIS — F4323 Adjustment disorder with mixed anxiety and depressed mood: Secondary | ICD-10-CM

## 2024-10-11 NOTE — Progress Notes (Signed)
 [facility] Counselor/Therapist Progress Note  Patient ID: Suzanne Mays, MRN: [id number]    Date: 10/11/2024  Time Spent: 5:00  pm - 6:00 pm : 60 Minutes  Treatment Type: Individual Therapy.  Reported Symptoms: Patient appeared anxious but organized and reflective. Affect congruent with reported stress. Thought process logical and goal-oriented. No safety concerns expressed.  Mental Status Exam: Appearance:  Casual     Behavior: Appropriate, Sharing, and Rationalizing  Motor: Normal  Speech/Language:  Normal Rate  Affect: Appropriate  Mood: anxious  Thought process: flight of ideas  Thought content:   WNL  Sensory/Perceptual disturbances:   WNL  Orientation: oriented to person, place, and time/date  Attention: Good  Concentration: Good  Memory: WNL  Fund of knowledge:  Good  Insight:   Good  Judgment:  Good  Impulse Control: Good   Risk Assessment: Danger to Self:  No Self-injurious Behavior: No Danger to Others: No Duty to Warn:no Physical Aggression / Violence:No  Access to Firearms a concern: No  Gang Involvement:No   Subjective:   Suzanne Mays participated from home, via video, and consented to treatment. I discussed the limitations of evaluation and management by telemedicine and the availability of in person appointments. The patient expressed understanding and agreed to proceed.  Therapist participated from home office.  Suzanne Mays reviewed the events of the past week. Patient reports increased distress since last session following a performance review in which she was reprimanded for a perceived grammar issue. Patient states she used [employer] and was reassured by a coworker that the issue reflected supervisor preference rather than an error. Patient notes supervisor was recently promoted, which may be contributing to increased scrutiny. Incident significantly shook patients confidence, prompting her to apply to ~15 jobs. Patient reports ongoing stress  related to caregiving for her mother undergoing chemotherapy in [place], with frequent travel between [place] and [place]. Patient has disclosed caregiving demands to supervisor, who has provided flexibility; however, patient now plans to limit sharing details due to concern information could be used against her.     Interventions: Assertiveness/Communication, Solution-Oriented/Positive Psychology, and Insight-Oriented  Diagnosis:  Adjustment disorder with mixed anxiety and depressed mood  Psychiatric Treatment: No , N/A  Treatment Plan:  Client Abilities/Strengths Suzanne Mays is open to sessions.    Support System: Family and Friends   [employer] Officer of Needs Suzanne Mays would like to  identify triggers for anxiety and fear, particularly related to caregiving and interpersonal stress.        Treatment Level Weekly  Symptoms  Emotional overwhelm   (Status: declined) Heightened anxiety   (Status: declined)  Goals:   Suzanne Mays Workplace stress and perceived criticism contributing to heightened anxiety, job insecurity, and hypervigilance. Caregiver burden remains significant, compounded by financial strain and lack of delegated family support. Insight noted regarding need for improved assertiveness and boundary setting. Current treatment goals remain relevant; transition to assertiveness-focused goals planned. Treatment will continue with the current focus through the end of the year, with preparation underway to transition to a new treatment goal in January 2026 centered on assertiveness, boundary setting, and effective delegation of caregiving responsibilities to support employment stability. Sessions will also address strategies for maintaining appropriate professional boundaries with the supervisor and navigating increased in-office expectations following the holidays. The next session is scheduled in two weeks, resuming the first week of January due to the holiday  schedule.   Target Date: 10/23/2024 Frequency: Weekly  Progress: 0 Modality: individual    Therapist will  provide referrals for additional resources as appropriate.  Therapist will provide psycho-education regarding Suzanne Mays diagnosis and corresponding treatment approaches and interventions. Licensed Clinical Mental Health Counselor, [name], [facility] will support the patient's ability to achieve the goals identified. will employ CBT, BA, Problem-solving, Solution Focused, Mindfulness,  coping skills, & other evidenced-based practices will be used to promote progress towards healthy functioning to help manage decrease symptoms associated with her diagnosis.   Reduce overall level, frequency, and intensity of the feelings of depression, anxiety and panic evidenced by decreased self abandonment  from 6 to 7 days/week to 0 to 1 days/week per client report for at least 3 consecutive months. Verbally express understanding of the relationship between feelings of depression, anxiety and their impact on thinking patterns and behaviors. Verbalize an understanding of the role that distorted thinking plays in creating fears, excessive worry, and ruminations.  (Suzanne Mays participated in the creation of the treatment plan)   [name], LCMHC

## 2024-10-23 ENCOUNTER — Encounter: Payer: Self-pay | Admitting: Licensed Clinical Social Worker

## 2024-10-23 ENCOUNTER — Ambulatory Visit: Admitting: Licensed Clinical Social Worker

## 2024-10-23 DIAGNOSIS — F4323 Adjustment disorder with mixed anxiety and depressed mood: Secondary | ICD-10-CM | POA: Diagnosis not present

## 2024-10-23 NOTE — Progress Notes (Signed)
  [facility] Counselor/Therapist Progress Note  Patient ID: Suzanne Mays, MRN: [id number]    Date: 10/23/2024  Time Spent: 3:03  pm - 4:00 pm : 57 Minutes  Treatment Type: Individual Therapy.  Reported Symptoms: Patient appeared emotionally charged at session start but was able to regulate affect through discussion. She was articulate, reflective, and engaged. Patient demonstrated insight into emotional triggers and effective use of coping skills to remain calm and assertive in recent interactions.  Mental Status Exam: Appearance:  Well Groomed     Behavior: Appropriate and Sharing  Motor: Normal  Speech/Language:  Normal Rate  Affect: Appropriate  Mood: anxious  Thought process: circumstantial and tangential  Thought content:   Tangential  Sensory/Perceptual disturbances:   WNL  Orientation: oriented to person, place, and time/date  Attention: Good  Concentration: Good  Memory: WNL  Fund of knowledge:  Good  Insight:   Good  Judgment:  Good  Impulse Control: Good   Risk Assessment: Danger to Self:  No Self-injurious Behavior: No Danger to Others: No Duty to Warn:no Physical Aggression / Violence:No  Access to Firearms a concern: No  Gang Involvement:No   Subjective:   Suzanne Mays participated from car, via video, and consented to treatment. I discussed the limitations of evaluation and management by telemedicine and the availability of in person appointments. The patient expressed understanding and agreed to proceed.  Therapist participated from home office.  Suzanne Mays reviewed the events of the past week. Patient reported needing to use session time to process acute stress related to her mothers hospital admission due to low WBC following chemotherapy, poor intake, and overall decline. Patient stated this stress has increased emotional reactivity and contributed to tension with her sister and the man she is dating. She reported intentionally using coping and  communication strategies to assert her needs rather than defaulting to passivity     Interventions: Assertiveness/Communication and Solution-Oriented/Positive Psychology  Diagnosis:  Adjustment disorder with mixed anxiety and depressed mood  Psychiatric Treatment: No , N/A  Treatment Plan:  Client Abilities/Strengths Suzanne Mays is open to sessions.    Support System: Family and Friends   [employer] Officer of Needs Suzanne Mays would like to identify triggers for anxiety and fear, particularly related to caregiving and interpersonal stress.    Treatment Level Weekly  Symptoms  Acute stress   (Status: declined) Emotional reactivity   (Status: declined)  Goals:   Suzanne Mays Acute situational stress related to maternal health has increased emotional intensity. Patient is demonstrating improved emotional regulation, assertiveness, and application of therapeutic skills despite stressors. No safety concerns noted. Continue therapy with focus on emotional regulation, boundary-setting, and self-care while supporting mother through hospitalization. Patient will continue practicing assertive communication and coping strategies over the next week. Review progress and reassess treatment goals next session.   Target Date: 10/23/24 Frequency: Weekly  Progress: 0 Modality: individual    Therapist will provide referrals for additional resources as appropriate.  Therapist will provide psycho-education regarding Suzanne Mays's diagnosis and corresponding treatment approaches and interventions. Licensed Clinical Mental Health Counselor, Suzanne Mays, [facility] will support the patient's ability to achieve the goals identified. will employ CBT, BA, Problem-solving, Solution Focused, Mindfulness,  coping skills, & other evidenced-based practices will be used to promote progress towards healthy functioning to help manage decrease symptoms associated with her diagnosis.   Reduce  overall level, frequency, and intensity of the feelings of depression, anxiety and panic evidenced by decreased self abandonment  from 6 to 7 days/week  to 0 to 1 days/week per client report for at least 3 consecutive months. Verbally express understanding of the relationship between feelings of depression, anxiety and their impact on thinking patterns and behaviors. Verbalize an understanding of the role that distorted thinking plays in creating fears, excessive worry, and ruminations.  ([name] participated in the creation of the treatment plan)   Suzanne Mays, LCMHC

## 2024-10-30 ENCOUNTER — Ambulatory Visit: Admitting: Licensed Clinical Social Worker

## 2024-10-30 DIAGNOSIS — F4323 Adjustment disorder with mixed anxiety and depressed mood: Secondary | ICD-10-CM | POA: Diagnosis not present

## 2024-10-30 NOTE — Progress Notes (Signed)
 [facility] Counselor/Therapist Progress Note  Patient ID: Suzanne Mays, MRN: [id number]    Date: 10/30/2024  Time Spent: 4:00  pm - 4:57 pm : 57 Minutes  Treatment Type: Individual Therapy.  Reported Symptoms: Patient engaged, reflective, and demonstrates insight into emotional patterns and effective use of coping skills. Affect stable and congruent; thought process organized.  Mental Status Exam: Appearance:  Well Groomed     Behavior: Appropriate and Sharing  Motor: Normal  Speech/Language:  Normal Rate  Affect: Appropriate  Mood: normal  Thought process: normal  Thought content:   WNL  Sensory/Perceptual disturbances:   WNL  Orientation: oriented to person, place, and time/date  Attention: Good  Concentration: Good  Memory: WNL  Fund of knowledge:  Good  Insight:   Good  Judgment:  Good  Impulse Control: Good   Risk Assessment: Danger to Self:  No Self-injurious Behavior: No Danger to Others: No Duty to Warn:no Physical Aggression / Violence:No  Access to Firearms a concern: No  Gang Involvement:No   Subjective:   Suzanne Mays participated from home, via video, and consented to treatment. I discussed the limitations of evaluation and management by telemedicine and the availability of in person appointments. The patient expressed understanding and agreed to proceed.  Therapist participated from home office.  Suzanne Mays reviewed the events of the past week. Patient reports significant progress toward current goal, noting improved awareness of emotional triggers related to caregiving and interpersonal relationships. She feels more confident identifying fears, using coping strategies, and communicating assertively at work and within her family. Patient reports upcoming stressors, including her mothers recovery and preparation for radiation treatment, and plans to return from [facility] at the end of January.    Interventions: Solution-Oriented/Positive Psychology and  Insight-Oriented  Diagnosis:  Adjustment disorder with mixed anxiety and depressed mood  Psychiatric Treatment: No , N/A  Treatment Plan:  Client Abilities/Strengths Suzanne Mays is open to sessions.    Support System: Family and Friends   [employer] Officer of Needs Suzanne Mays would like to identify triggers for anxiety and fear, particularly related to caregiving and interpersonal stress.       Treatment Level Biweekly  Symptoms  Increased self-awareness of emotional triggers and improved use of coping and assertive communication skills   (Status: improved) Heightened stress and emotional vulnerability related to caregiving demands and upcoming family medical transitions   (Status: maintained)  Goals:   Suzanne Mays Patient has made substantial progress toward treatment goal, with improved emotional regulation, coping, and assertive communication. Demonstrates increased capacity for independent skill use. Current stressors warrant continued support at a reduced frequency. Current treatment goal has been met. Sessions will transition to a biweekly schedule, with the next session planned for the last Thursday of the month. This adjustment allows the patient time for independent processing during her mothers recovery period. The next session will focus on reviewing recent events, assessing ongoing needs, and determining whether to establish a new treatment goal or transition to as-needed support.  Target Date: 10/30/24 Frequency: BiWeekly  Progress: 85% Modality: individual    Therapist will provide referrals for additional resources as appropriate.  Therapist will provide psycho-education regarding Suzanne Mays's diagnosis and corresponding treatment approaches and interventions. Licensed Clinical Mental Health Counselor, [name], [facility] will support the patient's ability to achieve the goals identified. will employ CBT, BA, Problem-solving, Solution  Focused, Mindfulness,  coping skills, & other evidenced-based practices will be used to promote progress towards healthy functioning to help manage decrease symptoms  associated with her diagnosis.   Reduce overall level, frequency, and intensity of the feelings of depression, anxiety and panic evidenced by decreased self abandonment from 6 to 7 days/week to 0 to 1 days/week per client report for at least 3 consecutive months. Verbally express understanding of the relationship between feelings of depression, anxiety and their impact on thinking patterns and behaviors. Verbalize an understanding of the role that distorted thinking plays in creating fears, excessive worry, and ruminations.  (Suzanne Mays participated in the creation of the treatment plan)   [name], LCMHC

## 2024-11-06 ENCOUNTER — Ambulatory Visit: Admitting: Nurse Practitioner

## 2024-11-16 ENCOUNTER — Ambulatory Visit: Admitting: Licensed Clinical Social Worker

## 2024-11-16 DIAGNOSIS — F4323 Adjustment disorder with mixed anxiety and depressed mood: Secondary | ICD-10-CM | POA: Diagnosis not present

## 2024-11-16 NOTE — Progress Notes (Signed)
 [facility] Counselor/Therapist Progress Note  Patient ID: Suzanne Mays, MRN: [id number]    Date: 11/16/24  Time Spent: 4:00  pm - 4:58 pm : 58 Minutes  Treatment Type: Individual Therapy.  Reported Symptoms: Patient appears relieved and hopeful. Mood improved; affect congruent. Thought process coherent and future oriented. No acute distress observed.  Mental Status Exam: Appearance:  Well Groomed     Behavior: Appropriate and Sharing  Motor: Normal  Speech/Language:  Normal Rate  Affect: Appropriate  Mood: normal  Thought process: normal  Thought content:   WNL  Sensory/Perceptual disturbances:   WNL  Orientation: oriented to person, place, and time/date  Attention: Good  Concentration: Good  Memory: WNL  Fund of knowledge:  Good  Insight:   Good  Judgment:  Good  Impulse Control: Good   Risk Assessment: Danger to Self:  No Self-injurious Behavior: No Danger to Others: No Duty to Warn:no Physical Aggression / Violence:No  Access to Firearms a concern: No  Gang Involvement:No   Subjective:   Suzanne Mays participated from community, via video, and consented to treatment. I discussed the limitations of evaluation and management by telemedicine and the availability of in person appointments. The patient expressed understanding and agreed to proceed.  Therapist participated from home office.  Suzanne Mays reviewed the events of the past week. Patient reports she and the person she has been dating are taking a brief break due to emotional distance and limited attention amid mutual stressors. Shares significant relief and gratitude as mother, previously hospitalized with cancer, is now reported to be essentially cancer-free and transitioning to post-care needs. Patient expresses readiness to reengage in her own life after prolonged caregiving, including returning to her apartment and work in [place]. Reports improved sleep and quality of life.     Interventions:  Insight-Oriented  Diagnosis:  Adjustment disorder with mixed anxiety and depressed mood  Psychiatric Treatment: No , N/A  Treatment Plan:  Client Abilities/Strengths Suzanne Mays is open to sessions.    Support System: Family and Friends  [employer] Officer of Needs Suzanne Mays would like to  identify triggers for anxiety and fear, particularly related to caregiving and interpersonal stress.     Treatment Level BiWeekly  Symptoms  Improved sleep and overall quality of life as caregiving burden decreases   (Status: improved) Emotional strain related to relationship uncertainty   (Status: maintained)  Goals:   Suzanne Mays Adjustment to changing relational dynamics and caregiving role. Decreased caregiver burden with improved sleep and emotional functioning. Positive response to reduced stress and increased support. Provided supportive counseling and reinforced balance between caregiving and self-care. Encouraged continued shared family support and gradual reengagement in personal and occupational activities. Continue therapy to monitor adjustment and relational stress. Follow-up scheduled in two weeks. During next session we will determine new goal setting or plan of care.     Target Date: 11/16/24 Frequency: Biweekly  Progress: 0 Modality: individual    Therapist will provide referrals for additional resources as appropriate.  Therapist will provide psycho-education regarding Suzanne Mays's diagnosis and corresponding treatment approaches and interventions. Licensed Clinical Mental Health Counselor, [name], [facility] will support the patient's ability to achieve the goals identified. will employ CBT, BA, Problem-solving, Solution Focused, Mindfulness,  coping skills, & other evidenced-based practices will be used to promote progress towards healthy functioning to help manage decrease symptoms associated with her diagnosis.   Reduce overall level, frequency, and  intensity of the feelings of depression, anxiety and panic evidenced by decreased  self abandonment  from 6 to 7 days/week to 0 to 1 days/week per client report for at least 3 consecutive months. Verbally express understanding of the relationship between feelings of depression, anxiety and their impact on thinking patterns and behaviors. Verbalize an understanding of the role that distorted thinking plays in creating fears, excessive worry, and ruminations.  (Suzanne Mays participated in the creation of the treatment plan)   [name], LCMHC

## 2024-11-23 ENCOUNTER — Other Ambulatory Visit (HOSPITAL_BASED_OUTPATIENT_CLINIC_OR_DEPARTMENT_OTHER): Payer: Self-pay

## 2024-11-23 ENCOUNTER — Ambulatory Visit: Admitting: Nurse Practitioner

## 2024-11-23 ENCOUNTER — Encounter: Payer: Self-pay | Admitting: Nurse Practitioner

## 2024-11-23 VITALS — BP 122/64 | HR 92 | Ht 64.0 in | Wt 142.0 lb

## 2024-11-23 DIAGNOSIS — Z8041 Family history of malignant neoplasm of ovary: Secondary | ICD-10-CM

## 2024-11-23 DIAGNOSIS — Z800 Family history of malignant neoplasm of digestive organs: Secondary | ICD-10-CM | POA: Insufficient documentation

## 2024-11-23 DIAGNOSIS — F330 Major depressive disorder, recurrent, mild: Secondary | ICD-10-CM

## 2024-11-23 DIAGNOSIS — E7800 Pure hypercholesterolemia, unspecified: Secondary | ICD-10-CM

## 2024-11-23 DIAGNOSIS — Z803 Family history of malignant neoplasm of breast: Secondary | ICD-10-CM

## 2024-11-23 MED ORDER — HYDROXYZINE PAMOATE 25 MG PO CAPS
25.0000 mg | ORAL_CAPSULE | Freq: Every evening | ORAL | 5 refills | Status: AC | PRN
  Filled 2024-11-23: qty 15, 15d supply, fill #0

## 2024-11-23 NOTE — Patient Instructions (Signed)
 Call office for additional vistaril  refill if med is helpful.  Mediterranean Diet A Mediterranean diet is based on the traditions of countries on the [employer]. It focuses on eating more: Fruits and vegetables. Whole grains, beans, nuts, and seeds. Heart-healthy fats. These are fats that are good for your heart. It involves eating less: Dairy. Meat and eggs. Processed foods with added sugar, salt, and fat. This type of diet can help prevent certain conditions. It can also improve outcomes if you have a long-term (chronic) disease, such as kidney or heart disease. What are tips for following this plan? Reading food labels Check packaged foods for: The serving size. For foods such as rice and pasta, the serving size is the amount of cooked product, not dry. The total fat. Avoid foods with saturated fat or trans fat. Added sugars, such as corn syrup. Shopping  Try to have a balanced diet. Buy a variety of foods, such as: Fresh fruits and vegetables. You may be able to get these from local farmers markets. You can also buy them frozen. Grains, beans, nuts, and seeds. Some of these can be bought in bulk. Fresh seafood. Poultry and eggs. Low-fat dairy products. Buy whole ingredients instead of foods that have already been packaged. If you can't get fresh seafood, buy precooked frozen shrimp or canned fish, such as tuna, salmon, or sardines. Stock your pantry so you always have certain foods on hand, such as olive oil, canned tuna, canned tomatoes, rice, pasta, and beans. Cooking Cook foods with extra-virgin olive oil instead of using butter or other vegetable oils. Have meat as a side dish. Have vegetables or grains as your main dish. This means having meat in small portions or adding small amounts of meat to foods like pasta or stew. Use beans or vegetables instead of meat in common dishes like chili or lasagna. Try out different cooking methods. Try roasting, broiling, steaming,  and sauting vegetables. Add frozen vegetables to soups, stews, pasta, or rice. Add nuts or seeds for added healthy fats and plant protein at each meal. You can add these to yogurt, salads, or vegetable dishes. Marinate fish or vegetables using olive oil, lemon juice, garlic, and fresh herbs. Meal planning Plan to eat a vegetarian meal one day each week. Try to work up to two vegetarian meals, if possible. Eat seafood two or more times a week. Have healthy snacks on hand. These may include: Vegetable sticks with hummus. Greek yogurt. Fruit and nut trail mix. Eat balanced meals. These should include: Fruit: 2-3 servings a day. Vegetables: 4-5 servings a day. Low-fat dairy: 2 servings a day. Fish, poultry, or lean meat: 1 serving a day. Beans and legumes: 2 or more servings a week. Nuts and seeds: 1-2 servings a day. Whole grains: 6-8 servings a day. Extra-virgin olive oil: 3-4 servings a day. Limit red meat and sweets to just a few servings a month. Lifestyle  Try to cook and eat meals with your family. Drink enough fluid to keep your pee (urine) pale yellow. Be active every day. This includes: Aerobic exercise, which is exercise that causes your heart to beat faster. Examples include running and swimming. Leisure activities like gardening, walking, or housework. Get 7-8 hours of sleep each night. Drink red wine if your provider says you can. A glass of wine is 5 oz (150 mL). You may be allowed to have: Up to 1 glass a day if you're female and not pregnant. Up to 2 glasses a day if  you're female. What foods should I eat? Fruits Apples. Apricots. Avocado. Berries. Bananas. Cherries. Dates. Figs. Grapes. Lemons. Melon. Oranges. Peaches. Plums. Pomegranate. Vegetables Artichokes. Beets. Broccoli. Cabbage. Carrots. Eggplant. Green beans. Chard. Kale. Spinach. Onions. Leeks. Peas. Squash. Tomatoes. Peppers. Radishes. Grains Whole-grain pasta. Brown rice. Bulgur wheat. Polenta. Couscous.  Whole-wheat bread. [place] Madeira. Meats and other proteins Beans. Almonds. Sunflower seeds. Pine nuts. Peanuts. Cod. Salmon. Scallops. Shrimp. Tuna. Tilapia. Clams. Oysters. Eggs. Chicken or turkey without skin. Dairy Low-fat milk. Cheese. Greek yogurt. Fats and oils Extra-virgin olive oil. Avocado oil. Grapeseed oil. Beverages Water. Red wine. Herbal tea. Sweets and desserts Greek yogurt with honey. Baked apples. Poached pears. Trail mix. Seasonings and condiments Basil. Cilantro. Coriander. Cumin. Mint. Parsley. Sage. Rosemary. Tarragon. Garlic. Oregano. Thyme. Pepper. Balsamic vinegar. Tahini. Hummus. Tomato sauce. Olives. Mushrooms. The items listed above may not be all the foods and drinks you can have. Talk to a dietitian to learn more. What foods should I limit? This is a list of foods that should be eaten rarely. Fruits Fruit canned in syrup. Vegetables Deep-fried potatoes, like French fries. Grains Packaged pasta or rice dishes. Cereal with added sugar. Snacks with added sugar. Meats and other proteins Beef. Pork. Lamb. Chicken or turkey with skin. Hot dogs. [place]. Dairy Ice cream. Sour cream. Whole milk. Fats and oils Butter. Canola oil. Vegetable oil. Beef fat (tallow). Lard. Beverages Juice. Sugar-sweetened soft drinks. Beer. Liquor and spirits. Sweets and desserts Cookies. Cakes. Pies. Candy. Seasonings and condiments Mayonnaise. Pre-made sauces and marinades. The items listed above may not be all the foods and drinks you should limit. Talk to a dietitian to learn more. Where to find more information American Heart Association (AHA): heart.org This information is not intended to replace advice given to you by your health care provider. Make sure you discuss any questions you have with your health care provider. Document Revised: 01/17/2023 Document Reviewed: 01/17/2023 Elsevier Patient Education  2024 [employer].
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
## 2024-11-23 NOTE — Assessment & Plan Note (Signed)
 LDL at 160 Advised about the importance of mediterranean diet

## 2024-11-23 NOTE — Assessment & Plan Note (Addendum)
 Reports improved mood, but persistent insomnia and daytime fatigue. Has maintain CBT sessions Unable to fall asleep before 2am dur to racing thought No improvement with avoiding electronics, change in room temperature, avoiding caffeine and alcohol.  Sent vistaril  25mg  at hs F/up in 3months or sooner if needed

## 2024-11-30 ENCOUNTER — Ambulatory Visit: Admitting: Licensed Clinical Social Worker

## 2025-02-22 ENCOUNTER — Ambulatory Visit: Admitting: Nurse Practitioner
